# Patient Record
Sex: Female | Born: 1970 | Race: White | Hispanic: No | Marital: Single | Smoking: Never smoker
Health system: Southern US, Community
[De-identification: ages and names within clinical notes are randomized; demographics above are authoritative.]

## PROBLEM LIST (undated history)

## (undated) DIAGNOSIS — Z9289 Personal history of other medical treatment: Secondary | ICD-10-CM

## (undated) DIAGNOSIS — K589 Irritable bowel syndrome without diarrhea: Secondary | ICD-10-CM

## (undated) DIAGNOSIS — G43909 Migraine, unspecified, not intractable, without status migrainosus: Secondary | ICD-10-CM

## (undated) DIAGNOSIS — J302 Other seasonal allergic rhinitis: Secondary | ICD-10-CM

## (undated) DIAGNOSIS — M797 Fibromyalgia: Secondary | ICD-10-CM

## (undated) DIAGNOSIS — M1990 Unspecified osteoarthritis, unspecified site: Secondary | ICD-10-CM

## (undated) DIAGNOSIS — D509 Iron deficiency anemia, unspecified: Secondary | ICD-10-CM

## (undated) DIAGNOSIS — N920 Excessive and frequent menstruation with regular cycle: Secondary | ICD-10-CM

## (undated) DIAGNOSIS — Z9889 Other specified postprocedural states: Secondary | ICD-10-CM

## (undated) DIAGNOSIS — Z973 Presence of spectacles and contact lenses: Secondary | ICD-10-CM

## (undated) HISTORY — PX: UPPER GI ENDOSCOPY: SHX6162

---

## 1993-02-13 HISTORY — PX: TUBAL LIGATION: SHX77

## 2004-03-05 ENCOUNTER — Emergency Department: Payer: Self-pay | Admitting: Unknown Physician Specialty

## 2004-04-17 ENCOUNTER — Inpatient Hospital Stay: Payer: Self-pay | Admitting: Anesthesiology

## 2004-04-17 ENCOUNTER — Other Ambulatory Visit: Payer: Self-pay

## 2005-03-22 ENCOUNTER — Emergency Department: Payer: Self-pay | Admitting: Emergency Medicine

## 2006-02-13 HISTORY — PX: LAPAROSCOPIC CHOLECYSTECTOMY: SUR755

## 2010-02-26 ENCOUNTER — Emergency Department (HOSPITAL_COMMUNITY)
Admission: EM | Admit: 2010-02-26 | Discharge: 2010-02-26 | Payer: Self-pay | Source: Home / Self Care | Admitting: Emergency Medicine

## 2010-02-26 ENCOUNTER — Encounter: Payer: Self-pay | Admitting: Cardiology

## 2010-02-28 LAB — COMPREHENSIVE METABOLIC PANEL
ALT: 15 U/L (ref 0–35)
AST: 17 U/L (ref 0–37)
Albumin: 3.8 g/dL (ref 3.5–5.2)
Alkaline Phosphatase: 35 U/L — ABNORMAL LOW (ref 39–117)
BUN: 17 mg/dL (ref 6–23)
CO2: 24 mEq/L (ref 19–32)
Calcium: 8.9 mg/dL (ref 8.4–10.5)
Chloride: 107 mEq/L (ref 96–112)
Creatinine, Ser: 0.8 mg/dL (ref 0.4–1.2)
GFR calc Af Amer: >60 mL/min (ref >60)
GFR calc non Af Amer: >60 mL/min (ref >60)
Glucose, Bld: 69 mg/dL — ABNORMAL LOW (ref 70–99)
Potassium: 4 mEq/L (ref 3.5–5.1)
Sodium: 137 mEq/L (ref 135–145)
Total Bilirubin: 0.4 mg/dL (ref 0.3–1.2)
Total Protein: 6.6 g/dL (ref 6.0–8.3)

## 2010-02-28 LAB — CBC
HCT: 34.1 % — ABNORMAL LOW (ref 36.0–46.0)
Hemoglobin: 11.1 g/dL — ABNORMAL LOW (ref 12.0–15.0)
MCH: 29.4 pg (ref 26.0–34.0)
MCHC: 32.6 g/dL (ref 30.0–36.0)
MCV: 90.2 fL (ref 78.0–100.0)
Platelets: 220 10*3/uL (ref 150–400)
RBC: 3.78 MIL/uL — ABNORMAL LOW (ref 3.87–5.11)
RDW: 13.7 % (ref 11.5–15.5)
WBC: 4.4 10*3/uL (ref 4.0–10.5)

## 2010-02-28 LAB — POCT CARDIAC MARKERS
CKMB, poc: <1 ng/mL — ABNORMAL LOW (ref 1.0–8.0)
Myoglobin, poc: 30.3 ng/mL (ref 12–200)
Troponin i, poc: <0.05 ng/mL (ref 0.00–0.09)

## 2010-03-01 ENCOUNTER — Ambulatory Visit: Admission: RE | Admit: 2010-03-01 | Discharge: 2010-03-01 | Payer: Self-pay | Source: Home / Self Care

## 2010-03-01 ENCOUNTER — Ambulatory Visit
Admission: RE | Admit: 2010-03-01 | Discharge: 2010-03-01 | Payer: Self-pay | Source: Home / Self Care | Attending: Cardiology | Admitting: Cardiology

## 2010-03-01 DIAGNOSIS — R0789 Other chest pain: Secondary | ICD-10-CM | POA: Insufficient documentation

## 2010-03-03 ENCOUNTER — Telehealth (INDEPENDENT_AMBULATORY_CARE_PROVIDER_SITE_OTHER): Payer: Self-pay | Admitting: *Deleted

## 2010-03-04 ENCOUNTER — Ambulatory Visit (HOSPITAL_COMMUNITY)
Admission: RE | Admit: 2010-03-04 | Discharge: 2010-03-04 | Payer: Self-pay | Source: Home / Self Care | Attending: Physician Assistant | Admitting: Physician Assistant

## 2010-03-04 ENCOUNTER — Ambulatory Visit (HOSPITAL_COMMUNITY)
Admission: RE | Admit: 2010-03-04 | Discharge: 2010-03-04 | Payer: Self-pay | Source: Home / Self Care | Attending: Cardiology | Admitting: Cardiology

## 2010-03-04 ENCOUNTER — Ambulatory Visit: Admission: RE | Admit: 2010-03-04 | Discharge: 2010-03-04 | Payer: Self-pay | Source: Home / Self Care

## 2010-03-14 ENCOUNTER — Ambulatory Visit
Admission: RE | Admit: 2010-03-14 | Discharge: 2010-03-14 | Payer: Self-pay | Source: Home / Self Care | Attending: Cardiology | Admitting: Cardiology

## 2010-03-17 NOTE — Progress Notes (Signed)
Summary: stress echo appt  Phone Note Outgoing Call Call back at [facility] Phone [phone]   Call placed by: Stanton Kidney, EMT-P,  March 03, 2010 11:17 AM Action Taken: Phone Call Completed Summary of Call: Left message ref: stress echo appt/instructions. Stanton Kidney, EMT-P  March 03, 2010 11:17 AM

## 2010-03-23 NOTE — Assessment & Plan Note (Signed)
Summary: f/u echo/stress echo done 03-04-10/sl   Visit Type:  Follow-up Primary Provider:  None  CC:  CAD.  History of Present Illness: The patient presents for evaluation of chest pain. She had this about 2 weeks ago. It was somewhat atypical and she was seen in the emergency room. At that time she ruled out for myocardial infarction and was sent home. She came back to our clinic and had an exercise treadmill test with no ischemic changes but she did get chest pain. She was then referred for an exercise echocardiogram which demonstrated no evidence of ischemia or infarct. Since the 20th she has had no further chest discomfort, neck or arm discomfort. She has been doing her usual activities which includes dancing and she has had no symptoms related to this. She denies any palpitations, presyncope or syncope. She has had no weight gain or edema.  Current Medications (verified): 1)  Topamax 100 Mg Tabs (Topiramate) .... 3 By Mouth Daily 2)  Doxepin Hcl 10 Mg Caps (Doxepin Hcl) .... 2 By Mouth Daily 3)  Vicodin Es 7.5-750 Mg Tabs (Hydrocodone-Acetaminophen) .... As Needed 4)  Promethazine Hcl 25 Mg Tabs (Promethazine Hcl) .... As Needed 5)  Alprazolam 0.5 Mg Tabs (Alprazolam) .... 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   Vital Signs:  Patient profile:   40 year old female Height:      64 inches Weight:      132 pounds BMI:     22.74 Pulse rate:   86 / minute Resp:     16 per minute BP sitting:   111 / 66  (right arm)  Vitals Entered By: Marrion Coy, CNA (March 14, 2010 12:12 PM)  Physical Exam  General:  Well developed, well  nourished, in no acute distress. Head:  normocephalic and atraumatic Eyes:  PERRLA/EOM intact; conjunctiva and lids normal. Mouth:  Teeth, gums and palate normal. Oral mucosa normal. Neck:  Neck supple, no JVD. No masses, thyromegaly or abnormal cervical nodes. Chest Wall:  no deformities or breast masses noted Lungs:  Clear bilaterally to auscultation and percussion. Abdomen:  Bowel sounds positive; abdomen soft and non-tender without masses, organomegaly, or hernias noted. No hepatosplenomegaly. Msk:  Back normal, normal gait. Muscle strength and tone normal. Extremities:  No clubbing or cyanosis. Neurologic:  Alert and oriented x 3. Skin:  Intact without lesions or rashes. Cervical Nodes:  no significant adenopathy Inguinal Nodes:  no significant adenopathy Psych:  Normal affect.   Detailed Cardiovascular Exam  Neck    Carotids: Carotids full and equal bilaterally without bruits.      Neck Veins: Normal, no JVD.    Heart    Inspection: no deformities or lifts noted.      Palpation: normal PMI with no thrills palpable.      Auscultation: regular rate and rhythm, S1, S2 without murmurs, rubs, gallops, or clicks.    Vascular    Abdominal Aorta: no palpable masses, pulsations, or audible bruits.      Femoral Pulses: normal femoral pulses bilaterally.      Pedal Pulses: normal pedal pulses bilaterally.  Radial Pulses: normal radial pulses bilaterally.      Peripheral Circulation: no clubbing, cyanosis, or edema noted with normal capillary refill.     EKG  Procedure date:  02/26/2010  Findings:      Sinus rhythm with sinus arrhythmia, axis within normal limits, intervals within normal limits, no acute ST-T wave changes, RSR prime V1  Impression & Recommendations:  Problem # 1:  CHEST PAIN, ATYPICAL (ICD-786.59) Her chest pain is atypical. She had a negative stress test. She is having no further symptoms. No further cardiovascular testing is suggested. If this recurs I  would suggest a possible GI etiology.  Patient Instructions: 1)  Your physician recommends that you schedule a follow-up appointment as needed 2)  Your physician recommends that you continue on your current medications as directed. Please refer to the Current Medication list given to you today.

## 2010-11-15 ENCOUNTER — Inpatient Hospital Stay (INDEPENDENT_AMBULATORY_CARE_PROVIDER_SITE_OTHER)
Admission: RE | Admit: 2010-11-15 | Discharge: 2010-11-15 | Disposition: A | Discharge status: Home / Self Care | Payer: Self-pay | Source: Ambulatory Visit | Attending: Family Medicine | Admitting: Family Medicine

## 2010-11-15 DIAGNOSIS — B86 Scabies: Secondary | ICD-10-CM

## 2011-07-15 ENCOUNTER — Emergency Department (INDEPENDENT_AMBULATORY_CARE_PROVIDER_SITE_OTHER)
Admission: EM | Admit: 2011-07-15 | Discharge: 2011-07-15 | Disposition: A | Discharge status: Home / Self Care | Payer: Self-pay | Source: Home / Self Care | Attending: Emergency Medicine | Admitting: Emergency Medicine

## 2011-07-15 ENCOUNTER — Encounter (HOSPITAL_COMMUNITY): Payer: Self-pay | Admitting: *Deleted

## 2011-07-15 ENCOUNTER — Emergency Department (INDEPENDENT_AMBULATORY_CARE_PROVIDER_SITE_OTHER): Payer: Self-pay

## 2011-07-15 DIAGNOSIS — K2970 Gastritis, unspecified, without bleeding: Secondary | ICD-10-CM

## 2011-07-15 HISTORY — DX: Fibromyalgia: M79.7

## 2011-07-15 HISTORY — DX: Migraine, unspecified, not intractable, without status migrainosus: G43.909

## 2011-07-15 LAB — DIFFERENTIAL
Basophils Absolute: 0 10*3/uL (ref 0.0–0.1)
Basophils Relative: 0 % (ref 0–1)
Eosinophils Absolute: 0.1 10*3/uL (ref 0.0–0.7)
Eosinophils Relative: 1 % (ref 0–5)
Lymphocytes Relative: 25 % (ref 12–46)
Lymphs Abs: 1.6 10*3/uL (ref 0.7–4.0)
Monocytes Absolute: 0.5 10*3/uL (ref 0.1–1.0)
Monocytes Relative: 7 % (ref 3–12)
Neutro Abs: 4.4 10*3/uL (ref 1.7–7.7)
Neutrophils Relative %: 66 % (ref 43–77)

## 2011-07-15 LAB — POCT URINALYSIS DIP (DEVICE)
Bilirubin Urine: NEGATIVE
Glucose, UA: NEGATIVE mg/dL
Ketones, ur: NEGATIVE mg/dL
Leukocytes, UA: NEGATIVE
Nitrite: NEGATIVE
Protein, ur: NEGATIVE mg/dL
Specific Gravity, Urine: <=1.005 (ref 1.005–1.030)
Urobilinogen, UA: 0.2 mg/dL (ref 0.0–1.0)
pH: 7 (ref 5.0–8.0)

## 2011-07-15 LAB — WET PREP, GENITAL
Trich, Wet Prep: NONE SEEN
WBC, Wet Prep HPF POC: NONE SEEN
Yeast Wet Prep HPF POC: NONE SEEN

## 2011-07-15 LAB — CBC
HCT: 35.8 % — ABNORMAL LOW (ref 36.0–46.0)
Hemoglobin: 11.8 g/dL — ABNORMAL LOW (ref 12.0–15.0)
MCH: 30.1 pg (ref 26.0–34.0)
MCHC: 33 g/dL (ref 30.0–36.0)
MCV: 91.3 fL (ref 78.0–100.0)
Platelets: 266 10*3/uL (ref 150–400)
RBC: 3.92 MIL/uL (ref 3.87–5.11)
RDW: 14.7 % (ref 11.5–15.5)
WBC: 6.6 10*3/uL (ref 4.0–10.5)

## 2011-07-15 LAB — POCT PREGNANCY, URINE: Preg Test, Ur: NEGATIVE

## 2011-07-15 LAB — COMPREHENSIVE METABOLIC PANEL
ALT: 87 U/L — ABNORMAL HIGH (ref 0–35)
AST: 45 U/L — ABNORMAL HIGH (ref 0–37)
Albumin: 3.9 g/dL (ref 3.5–5.2)
Alkaline Phosphatase: 66 U/L (ref 39–117)
BUN: 11 mg/dL (ref 6–23)
CO2: 29 mEq/L (ref 19–32)
Calcium: 9.4 mg/dL (ref 8.4–10.5)
Chloride: 101 mEq/L (ref 96–112)
Creatinine, Ser: 0.59 mg/dL (ref 0.50–1.10)
GFR calc Af Amer: >90 mL/min (ref >90)
GFR calc non Af Amer: >90 mL/min (ref >90)
Glucose, Bld: 80 mg/dL (ref 70–99)
Potassium: 3.5 mEq/L (ref 3.5–5.1)
Sodium: 139 mEq/L (ref 135–145)
Total Bilirubin: 0.2 mg/dL — ABNORMAL LOW (ref 0.3–1.2)
Total Protein: 7.5 g/dL (ref 6.0–8.3)

## 2011-07-15 LAB — LIPASE, BLOOD: Lipase: 27 U/L (ref 11–59)

## 2011-07-15 LAB — POCT H PYLORI SCREEN: H. PYLORI SCREEN, POC: NEGATIVE

## 2011-07-15 MED ORDER — GI COCKTAIL ~~LOC~~
ORAL | Status: AC
  Filled 2011-07-15: qty 30

## 2011-07-15 MED ORDER — OMEPRAZOLE 20 MG PO CPDR: 20.0000 mg | DELAYED_RELEASE_CAPSULE | Freq: Every day | ORAL | Status: DC

## 2011-07-15 MED ORDER — SUCRALFATE 1 GM/10ML PO SUSP: 1.0000 g | Freq: Four times a day (QID) | ORAL | Status: DC

## 2011-07-15 MED ORDER — GI COCKTAIL ~~LOC~~
30.0000 mL | Freq: Once | ORAL | Status: AC
  Administered 2011-07-15: 30 mL via ORAL

## 2011-07-15 MED ORDER — ONDANSETRON 8 MG PO TBDP: 8.0000 mg | ORAL_TABLET | Freq: Three times a day (TID) | ORAL | Status: AC | PRN

## 2011-07-15 NOTE — ED Notes (Addendum)
Pt with onset of n/v and abdominal pain x 6 days - denies diarrhea or constipation - c/o feeling that hands/feet and abd swollen and intermittent dizziness - intermittent vomiting - constant nausea - ate lunch today one pm - vomited post lunch - keeping liquids down - denies vaginal discharge or irritation

## 2011-07-15 NOTE — ED Provider Notes (Signed)
Chief Complaint  Patient presents with  . Abdominal Pain  . Nausea  . Emesis    History of Present Illness:   Mrs. Elizabeth Kennedy is a 41 year old female with a one-week history of intermittent pain in the epigastrium and radiates to both lower quadrants. It feels like a cramping or squeezing pain and is rated 6-7/10 in intensity. The pain comes and goes, but can last for 3-4 hours at a time. It's worse with movement and better if she lies still. The pain is not any worse if she is food, but she does get nauseated if she eats. She has had some swelling of the abdomen and also noted generalized swelling of her arms and legs that occurred several days before the pain came on but now is gone. She denies any fever, chills, anorexia, or weight loss. She has felt constant nausea along with the pain and vomited once. She denies any blood in the vomitus or bilious emesis. Her last menstrual period was about 2 weeks ago. It lasted longer than usual and she had some spotting afterwards. She has had a bilateral tubal ligation. She has a history of GI issues in the past, she's not sure what the diagnosis was. She took medication for a few years but now is not taking anything. She has had a cholecystectomy. She denies any history of ulcers or gastritis.  Review of Systems:  Other than noted above, the patient denies any of the following symptoms: Constitutional:  No fever, chills, fatigue, weight loss or anorexia. Lungs:  No cough or shortness of breath. Heart:  No chest pain, palpitations, syncope or edema.  No cardiac history. Abdomen:  No nausea, vomiting, hematememesis, melena, diarrhea, or hematochezia. GU:  No dysuria, frequency, urgency, or hematuria. Gyn:  No vaginal discharge, itching, abnormal bleeding, dyspareunia, or pelvic pain. Skin:  No rash or itching.  PMFSH:  Past medical history, family history, social history, meds, and allergies were reviewed.  No prior abdominal surgeries, past history of GI  problems, STDs or GYN problems.  No history of aspirin or NSAID use.  No excessive alcohol intake.  Physical Exam:   Vital signs:  BP 114/64  Pulse 78  Temp(Src) 98.5 F (36.9 C) (Oral)  Resp 17  SpO2 98%  LMP 07/02/2011 Gen:  Alert, oriented, in no distress. Lungs:  Breath sounds clear and equal bilaterally.  No wheezes, rales or rhonchi. Heart:  Regular rhythm.  No gallops or murmers.   Abdomen:  Abdomen was soft, flat, nondistended. She has pain to palpation in the epigastrium and both lower cards but no guarding or rebound. No organomegaly or mass. Bowel sounds are normally active. Pelvic:  Normal external genitalia, vaginal and cervical mucosa were normal. She has mild cervical motion pain. Uterus is midposition normal in size and shape and is mildly tender. She has mild bilateral adnexal tenderness to palpation but no masses. Skin:  Clear, warm and dry.  No rash.  Labs:   Results for orders placed during the hospital encounter of 07/15/11  POCT URINALYSIS DIP (DEVICE)      Component Value Range   Glucose, UA NEGATIVE  NEGATIVE (mg/dL)   Bilirubin Urine NEGATIVE  NEGATIVE    Ketones, ur NEGATIVE  NEGATIVE (mg/dL)   Specific Gravity, Urine <=1.005  1.005 - 1.030    Hgb urine dipstick TRACE (*) NEGATIVE    pH 7.0  5.0 - 8.0    Protein, ur NEGATIVE  NEGATIVE (mg/dL)   Urobilinogen, UA 0.2  0.0 -  1.0 (mg/dL)   Nitrite NEGATIVE  NEGATIVE    Leukocytes, UA NEGATIVE  NEGATIVE   POCT PREGNANCY, URINE      Component Value Range   Preg Test, Ur NEGATIVE  NEGATIVE   CBC      Component Value Range   WBC 6.6  4.0 - 10.5 (K/uL)   RBC 3.92  3.87 - 5.11 (MIL/uL)   Hemoglobin 11.8 (*) 12.0 - 15.0 (g/dL)   HCT 16.1 (*) 09.6 - 46.0 (%)   MCV 91.3  78.0 - 100.0 (fL)   MCH 30.1  26.0 - 34.0 (pg)   MCHC 33.0  30.0 - 36.0 (g/dL)   RDW 04.5  40.9 - 81.1 (%)   Platelets 266  150 - 400 (K/uL)  DIFFERENTIAL      Component Value Range   Neutrophils Relative 66  43 - 77 (%)   Neutro Abs 4.4   1.7 - 7.7 (K/uL)   Lymphocytes Relative 25  12 - 46 (%)   Lymphs Abs 1.6  0.7 - 4.0 (K/uL)   Monocytes Relative 7  3 - 12 (%)   Monocytes Absolute 0.5  0.1 - 1.0 (K/uL)   Eosinophils Relative 1  0 - 5 (%)   Eosinophils Absolute 0.1  0.0 - 0.7 (K/uL)   Basophils Relative 0  0 - 1 (%)   Basophils Absolute 0.0  0.0 - 0.1 (K/uL)  COMPREHENSIVE METABOLIC PANEL      Component Value Range   Sodium 139  135 - 145 (mEq/L)   Potassium 3.5  3.5 - 5.1 (mEq/L)   Chloride 101  96 - 112 (mEq/L)   CO2 29  19 - 32 (mEq/L)   Glucose, Bld 80  70 - 99 (mg/dL)   BUN 11  6 - 23 (mg/dL)   Creatinine, Ser 9.14  0.50 - 1.10 (mg/dL)   Calcium 9.4  8.4 - 78.2 (mg/dL)   Total Protein 7.5  6.0 - 8.3 (g/dL)   Albumin 3.9  3.5 - 5.2 (g/dL)   AST 45 (*) 0 - 37 (U/L)   ALT 87 (*) 0 - 35 (U/L)   Alkaline Phosphatase 66  39 - 117 (U/L)   Total Bilirubin 0.2 (*) 0.3 - 1.2 (mg/dL)   GFR calc non Af Amer >90  >90 (mL/min)   GFR calc Af Amer >90  >90 (mL/min)  LIPASE, BLOOD      Component Value Range   Lipase 27  11 - 59 (U/L)  WET PREP, GENITAL      Component Value Range   Yeast Wet Prep HPF POC NONE SEEN  NONE SEEN    Trich, Wet Prep NONE SEEN  NONE SEEN    Clue Cells Wet Prep HPF POC FEW (*) NONE SEEN    WBC, Wet Prep HPF POC NONE SEEN  NONE SEEN   POCT H PYLORI SCREEN      Component Value Range   H. PYLORI SCREEN, POC NEGATIVE  NEGATIVE      Radiology:  Dg Abd Acute W/chest  07/15/2011  *RADIOLOGY REPORT*  Clinical Data: Abdominal pain for 1 week.  ACUTE ABDOMEN SERIES (ABDOMEN 2 VIEW & CHEST 1 VIEW)  Comparison: Chest x-ray 02/26/2010  Findings: Prior cholecystectomy.  Moderate stool burden throughout the colon. The bowel gas pattern is normal.  There is no evidence of free intraperitoneal air.  No suspicious radio-opaque calculi or other significant radiographic abnormality is seen. Heart size and mediastinal contours are within normal limits.  Both lungs are clear.  IMPRESSION: Moderate stool burden.  No  acute findings.  Original Report Authenticated By: Cyndie Chime, M.D.    Assessment:  The encounter diagnosis was Gastritis.  Plan:   1.  The following meds were prescribed:   New Prescriptions   OMEPRAZOLE (PRILOSEC) 20 MG CAPSULE    Take 1 capsule (20 mg total) by mouth daily.   ONDANSETRON (ZOFRAN ODT) 8 MG DISINTEGRATING TABLET    Take 1 tablet (8 mg total) by mouth every 8 (eight) hours as needed for nausea.   SUCRALFATE (CARAFATE) 1 GM/10ML SUSPENSION    Take 10 mLs (1 g total) by mouth 4 (four) times daily.   2.  The patient was instructed in symptomatic care and handouts were given. 3.  The patient was told to return if becoming worse in any way, if no better in 3 or 4 days, and given some red flag symptoms that would indicate earlier return.  Follow up:  The patient was told to follow up with Dr. Bosie Clos next week.     Reuben Likes, MD 07/15/11 [phone]

## 2011-07-15 NOTE — Discharge Instructions (Signed)
Gastritis Gastritis is an inflammation (the body's way of reacting to injury and/or infection) of the stomach. It is often caused by viral or bacterial (germ) infections. It can also be caused by chemicals (including alcohol) and medications. This illness may be associated with generalized malaise (feeling tired, not well), cramps, and fever. The illness may last 2 to 7 days. If symptoms of gastritis continue, gastroscopy (looking into the stomach with a telescope-like instrument), biopsy (taking tissue samples), and/or blood tests may be necessary to determine the cause. Antibiotics will not affect the illness unless there is a bacterial infection present. One common bacterial cause of gastritis is an organism known as H. Pylori. This can be treated with antibiotics. Other forms of gastritis are caused by too much acid in the stomach. They can be treated with medications such as H2 blockers and antacids. Home treatment is usually all that is needed. Young children will quickly become dehydrated (loss of body fluids) if vomiting and diarrhea are both present. Medications may be given to control nausea. Medications are usually not given for diarrhea unless especially bothersome. Some medications slow the removal of the virus from the gastrointestinal tract. This slows down the healing process. HOME CARE INSTRUCTIONS Home care instructions for nausea and vomiting:  For adults: drink small amounts of fluids often. Drink at least 2 quarts a day. Take sips frequently. Do not drink large amounts of fluid at one time. This may worsen the nausea.   Only take over-the-counter or prescription medicines for pain, discomfort, or fever as directed by your caregiver.   Drink clear liquids only. Those are anything you can see through such as water, broth, or soft drinks.   Once you are keeping clear liquids down, you may start full liquids, soups, juices, and ice cream or sherbet. Slowly add bland (plain, not spicy)  foods to your diet.  Home care instructions for diarrhea:  Diarrhea can be caused by bacterial infections or a virus. Your condition should improve with time, rest, fluids, and/or anti-diarrheal medication.   Until your diarrhea is under control, you should drink clear liquids often in small amounts. Clear liquids include: water, broth, jell-o water and weak tea.  Avoid:  Milk.   Fruits.   Tobacco.   Alcohol.   Extremely hot or cold fluids.   Too much intake of anything at one time.  When your diarrhea stops you may add the following foods, which help the stool to become more formed:  Rice.   Bananas.   Apples without skin.   Dry toast.  Once these foods are tolerated you may add low-fat yogurt and low-fat cottage cheese. They will help to restore the normal bacterial balance in your bowel. Wash your hands well to avoid spreading bacteria (germ) or virus. SEEK IMMEDIATE MEDICAL CARE IF:   You are unable to keep fluids down.   Vomiting or diarrhea become persistent (constant).   Abdominal pain develops, increases, or localizes. (Right sided pain can be appendicitis. Left sided pain in adults can be diverticulitis.)   You develop a fever (an oral temperature above 102 F (38.9 C)).   Diarrhea becomes excessive or contains blood or mucus.   You have excessive weakness, dizziness, fainting or extreme thirst.   You are not improving or you are getting worse.   You have any other questions or concerns.  Document Released: 01/24/2001 Document Revised: 01/19/2011 Document Reviewed: 01/30/2005 [employer] Patient Information 2012 [employer]. 

## 2011-07-17 LAB — GC/CHLAMYDIA PROBE AMP, GENITAL
Chlamydia, DNA Probe: NEGATIVE
GC Probe Amp, Genital: NEGATIVE

## 2012-01-23 ENCOUNTER — Emergency Department (HOSPITAL_COMMUNITY)
Admission: EM | Admit: 2012-01-23 | Discharge: 2012-01-24 | Disposition: A | Discharge status: Home / Self Care | Payer: Self-pay | Attending: Emergency Medicine | Admitting: Emergency Medicine

## 2012-01-23 ENCOUNTER — Emergency Department (HOSPITAL_COMMUNITY): Payer: Self-pay

## 2012-01-23 ENCOUNTER — Encounter (HOSPITAL_COMMUNITY): Payer: Self-pay | Admitting: *Deleted

## 2012-01-23 ENCOUNTER — Encounter (HOSPITAL_COMMUNITY): Payer: Self-pay | Admitting: Emergency Medicine

## 2012-01-23 ENCOUNTER — Emergency Department (INDEPENDENT_AMBULATORY_CARE_PROVIDER_SITE_OTHER)
Admission: EM | Admit: 2012-01-23 | Discharge: 2012-01-23 | Disposition: A | Discharge status: Nursing Home (Includes ICF) | Payer: Self-pay | Source: Home / Self Care | Attending: Emergency Medicine | Admitting: Emergency Medicine

## 2012-01-23 DIAGNOSIS — Z8739 Personal history of other diseases of the musculoskeletal system and connective tissue: Secondary | ICD-10-CM | POA: Insufficient documentation

## 2012-01-23 DIAGNOSIS — Z79899 Other long term (current) drug therapy: Secondary | ICD-10-CM | POA: Insufficient documentation

## 2012-01-23 DIAGNOSIS — R0789 Other chest pain: Secondary | ICD-10-CM | POA: Insufficient documentation

## 2012-01-23 DIAGNOSIS — R079 Chest pain, unspecified: Secondary | ICD-10-CM

## 2012-01-23 DIAGNOSIS — Z8709 Personal history of other diseases of the respiratory system: Secondary | ICD-10-CM | POA: Insufficient documentation

## 2012-01-23 DIAGNOSIS — Z8669 Personal history of other diseases of the nervous system and sense organs: Secondary | ICD-10-CM | POA: Insufficient documentation

## 2012-01-23 DIAGNOSIS — R071 Chest pain on breathing: Secondary | ICD-10-CM

## 2012-01-23 DIAGNOSIS — R0781 Pleurodynia: Secondary | ICD-10-CM

## 2012-01-23 DIAGNOSIS — Z8679 Personal history of other diseases of the circulatory system: Secondary | ICD-10-CM | POA: Insufficient documentation

## 2012-01-23 LAB — POCT I-STAT TROPONIN I: Troponin i, poc: 0 ng/mL (ref 0.00–0.08)

## 2012-01-23 LAB — CBC
HCT: 33.1 % — ABNORMAL LOW (ref 36.0–46.0)
MCHC: 32.6 g/dL (ref 30.0–36.0)
Platelets: 246 10*3/uL (ref 150–400)
RDW: 14.7 % (ref 11.5–15.5)
WBC: 7.1 10*3/uL (ref 4.0–10.5)

## 2012-01-23 LAB — BASIC METABOLIC PANEL
Chloride: 101 mEq/L (ref 96–112)
GFR calc Af Amer: >90 mL/min (ref >90)
GFR calc non Af Amer: >90 mL/min (ref >90)
Potassium: 3.8 mEq/L (ref 3.5–5.1)
Sodium: 138 mEq/L (ref 135–145)

## 2012-01-23 MED ORDER — IBUPROFEN 400 MG PO TABS
600.0000 mg | ORAL_TABLET | Freq: Once | ORAL | Status: AC
  Administered 2012-01-23: 600 mg via ORAL
  Filled 2012-01-23: qty 2

## 2012-01-23 MED ORDER — OXYCODONE-ACETAMINOPHEN 5-325 MG PO TABS
1.0000 | ORAL_TABLET | Freq: Once | ORAL | Status: AC
  Administered 2012-01-23: 1 via ORAL
  Filled 2012-01-23: qty 1

## 2012-01-23 NOTE — ED Notes (Signed)
Pt reports zero pain when waking up in the mornings but increased pain on right chest throughout the day. Pt states pain increases with exhalation.

## 2012-01-23 NOTE — ED Notes (Signed)
Pt reports a long car trip--14 hours-- a week ago.  Before she got back home she started having anterior lower right chest pai, -which is sharp with a deep breath.  The pain has gotten worse each day.    She denies recent URI or fever.

## 2012-01-23 NOTE — ED Notes (Signed)
Pt reports last Monday she started to have R sided CP under R breast described as sharp and has been increasing everyday, reports worse pain with deep breath (esp on exhale); pt reports started after driving from [facility] to [place]; pt reports some nausea on Friday; intermittent SOB

## 2012-01-23 NOTE — ED Provider Notes (Signed)
History     CSN: [id number]  Arrival date & time 01/23/12  1728   First MD Initiated Contact with Patient 01/23/12 1859      Chief Complaint  Patient presents with  . Chest Pain    (Consider location/radiation/quality/duration/timing/severity/associated sxs/prior treatment) HPI Comments: She presents urgent care tonight complaining of ongoing and worsening right chest pain (patient points to lower and lateral aspect of her right rib cage), she describes been having the pain for a least 9 days after she returned from a driving trip to [place]. She does have some sporadic sensation of shortness of breath. Denies any cough congestion or fevers and no urinary symptoms. Patient is not a smoker but not taking any hormonal treatments no recent surgeries, has never been diagnosed with a DVT or PE before. Describes that the pain is somewhat constant now but it does worsen when she takes a deep breath, and even more when she exhales.  The history is provided by the patient.    Past Medical History  Diagnosis Date  . Migraines   . Fibromyalgia   . Neurological disorder     Past Surgical History  Procedure Date  . Cholecystectomy   . Tubal ligation     No family history on file.  History  Substance Use Topics  . Smoking status: Never Smoker   . Smokeless tobacco: Not on file  . Alcohol Use: No    OB History    Grav Para Term Preterm Abortions TAB SAB Ect Mult Living                  Review of Systems  Constitutional: Negative for fever, diaphoresis, activity change and appetite change.  HENT: Negative for ear pain, mouth sores, trouble swallowing, neck pain, neck stiffness, voice change and sinus pressure.   Eyes: Negative for discharge.  Respiratory: Negative for apnea and chest tightness.   Cardiovascular: Positive for chest pain. Negative for palpitations and leg swelling.  Gastrointestinal: Negative for abdominal pain.  Musculoskeletal: Negative for back pain.    Neurological: Negative for dizziness.    Allergies  Review of patient's allergies indicates no known allergies.  Home Medications   Current Outpatient Rx  Name  Route  Sig  Dispense  Refill  . HYDROCODONE-ACETAMINOPHEN 7.5-325 MG PO TABS   Oral   Take 1 tablet by mouth every 6 (six) hours as needed. 1/2 tablet         . NORTRIPTYLINE HCL PO   Oral   Take 20 mg by mouth 1 day or 1 dose.         Marland Kitchen OMEPRAZOLE 20 MG PO CPDR   Oral   Take 1 capsule (20 mg total) by mouth daily.   15 capsule   0   . PROMETHAZINE HCL 25 MG PO TABS   Oral   Take 25 mg by mouth every 6 (six) hours as needed.         . SUCRALFATE 1 GM/10ML PO SUSP   Oral   Take 10 mLs (1 g total) by mouth 4 (four) times daily.   420 mL   0   . TOPAMAX PO   Oral   Take by mouth 2 (two) times daily.           BP 103/67  Pulse 89  Temp 98.2 F (36.8 C) (Oral)  Resp 16  SpO2 100%  Physical Exam  Nursing note and vitals reviewed. Constitutional: Vital signs are normal. She appears well-developed  and well-nourished.  Non-toxic appearance. She does not have a sickly appearance. She does not appear ill. No distress.  HENT:  Head: Normocephalic.  Eyes: Conjunctivae normal are normal.  Cardiovascular: Normal rate and regular rhythm.  Exam reveals no gallop and no friction rub.   No murmur heard. Pulmonary/Chest: Effort normal and breath sounds normal. No respiratory distress. She has no wheezes. She has no rales. She exhibits no tenderness.    Skin: No rash noted. No erythema.    ED Course  Procedures (including critical care time)  Labs Reviewed - No data to display No results found.   1. Pleuritic chest pain       MDM  Right-sided pleuritic chest pain. Patient is describing pain for more than a week status post a long driving trip is oxygenating well in room air afebrile non-tachycardic and looks comfortable. Patient had been transfer to the emergency department to be consider for low  probability pulmonary thromboembolic event. Patient is agreeable to transfer and will pursue further evaluation in the emergency department.        Jimmie Molly, MD 01/23/12 Ernestina [place]

## 2012-01-24 LAB — D-DIMER, QUANTITATIVE: D-Dimer, Quant: 0.35 ug/mL-FEU (ref 0.00–0.48)

## 2012-01-24 MED ORDER — IBUPROFEN 600 MG PO TABS: 600.0000 mg | ORAL_TABLET | Freq: Three times a day (TID) | ORAL | Status: DC | PRN

## 2012-01-24 MED ORDER — OXYCODONE-ACETAMINOPHEN 5-325 MG PO TABS: 1.0000 | ORAL_TABLET | ORAL | Status: DC | PRN

## 2012-01-24 NOTE — ED Provider Notes (Signed)
History     CSN: [id number]  Arrival date & time 01/23/12  2440   First MD Initiated Contact with Patient 01/23/12 2305      Chief Complaint  Patient presents with  . Chest Pain     The history is provided by the patient.   patient reports developing right lateral chest pain 2 days ago that seems to be worse when she takes a deep breath.  No history of DVT or pulmonary embolism.  She does have a history of pleurisy.  Her father his had some arterial lower extremity issues but no venous issues.  The patient did recently drive from [place] to [place].  She's had some intermittent shortness of breath.  Her chest pain is intermittent.  She has no chest pain at night.  She usually awakens and does not have pain but reports that the right-sided chest pain gets worse as the day goes on.  Her pain is worse with movement and palpation and deep breathing.  Nothing improves her pain.  She tried over-the-counter pain medications without symptom relief.  Past Medical History  Diagnosis Date  . Migraines   . Fibromyalgia   . 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 Cholecystectomy   . Tubal ligation     No family history on file.  History  Substance Use Topics  . Smoking status: Never Smoker   . Smokeless tobacco: Not on file  . Alcohol Use: No    OB History    Grav Para Term Preterm Abortions TAB SAB Ect Mult Living                  Review of Systems  Cardiovascular: Positive for chest pain.  All other systems reviewed and are negative.    Allergies  Review of patient's allergies indicates no known allergies.  Home Medications   Current Outpatient Rx  Name  Route  Sig  Dispense  Refill  . CYCLOBENZAPRINE HCL 10 MG PO TABS   Oral   Take 10 mg by mouth 3 (three) times daily as needed. For pain         . HYDROCODONE-ACETAMINOPHEN 7.5-325 MG PO TABS   Oral   Take 0.5 tablets by mouth every 6 (six) hours as  needed. For pain         . LAMOTRIGINE 150 MG PO TABS   Oral   Take 150 mg by mouth daily.         Marland Kitchen PROMETHAZINE HCL 25 MG PO TABS   Oral   Take 25 mg by mouth every 6 (six) hours as needed. For nausea         . IBUPROFEN 600 MG PO TABS   Oral   Take 1 tablet (600 mg total) by mouth every 8 (eight) hours as needed for pain.   15 tablet   0   . OXYCODONE-ACETAMINOPHEN 5-325 MG PO TABS   Oral   Take 1 tablet by mouth every 4 (four) hours as needed for pain.   20 tablet   0   . SUCRALFATE 1 GM/10ML PO SUSP   Oral   Take 10 mLs (1 g total) by mouth 4 (four) times daily.   420 mL   0     BP 122/65  Pulse 90  Temp 98.3 F (36.8 C) (Oral)  Resp 18  SpO2 100%  LMP 01/19/2012  Physical Exam  Nursing note and vitals reviewed. Constitutional: She is oriented to person, place, and time. She appears well-developed and well-nourished. No distress.  HENT:  Head: Normocephalic and atraumatic.  Eyes: EOM are normal.  Neck: Normal range of motion.  Cardiovascular: Normal rate, regular rhythm and normal heart sounds.   Pulmonary/Chest: Effort normal and breath sounds normal.       Mild right-sided chest wall tenderness without rash.  No crepitus.  Abdominal: Soft. She exhibits no distension. There is no tenderness.  Musculoskeletal: Normal range of motion.  Neurological: She is alert and oriented to person, place, and time.  Skin: Skin is warm and dry.  Psychiatric: She has a normal mood and affect. Judgment normal.    ED Course  Procedures (including critical care time)  Labs Reviewed  CBC - Abnormal; Notable for the following:    RBC 3.73 (*)     Hemoglobin 10.8 (*)     HCT 33.1 (*)     All other components within normal limits  BASIC METABOLIC PANEL  POCT I-STAT TROPONIN I  D-DIMER, QUANTITATIVE   Dg Chest 2 View  01/23/2012  *RADIOLOGY REPORT*  Clinical Data: Right-sided chest pain.  History of benign pulmonary nodules.  CHEST - 2 VIEW  Comparison:  02/26/2010.  Findings:  Cardiopericardial silhouette within normal limits. Mediastinal contours normal. Trachea midline.  No airspace disease or effusion. Cholecystectomy clips are present in the right upper quadrant.  IMPRESSION: No active cardiopulmonary disease.   Original Report Authenticated By: Andreas Newport, M.D.     Date: 01/24/2012  Rate: 85  Rhythm: normal sinus rhythm  QRS Axis: normal  Intervals: normal  ST/T Wave abnormalities: normal  Conduction Disutrbances: none  Narrative Interpretation:   Old EKG Reviewed: No significant changes noted     1. Chest pain       MDM  Chest x-ray EKG and d-dimer all normal.  I suspect this is pleurisy.  Her EKG is normal sinus rhythm.  Discharge home with anti-inflammatories and pain medicine.  Close PCP followup.  The patient understands return to the ER for new or worsening symptoms        Lyanne Co, MD 01/24/12 [phone]

## 2012-11-08 ENCOUNTER — Ambulatory Visit (HOSPITAL_COMMUNITY)
Admission: RE | Admit: 2012-11-08 | Discharge: 2012-11-08 | Disposition: A | Discharge status: Home / Self Care | Payer: Self-pay | Source: Ambulatory Visit | Attending: Family Medicine | Admitting: Family Medicine

## 2012-11-08 ENCOUNTER — Other Ambulatory Visit (HOSPITAL_COMMUNITY): Payer: Self-pay | Admitting: Family Medicine

## 2012-11-08 DIAGNOSIS — K838 Other specified diseases of biliary tract: Secondary | ICD-10-CM | POA: Insufficient documentation

## 2012-11-08 DIAGNOSIS — N83209 Unspecified ovarian cyst, unspecified side: Secondary | ICD-10-CM | POA: Insufficient documentation

## 2012-11-08 DIAGNOSIS — K37 Unspecified appendicitis: Secondary | ICD-10-CM

## 2012-11-08 DIAGNOSIS — N949 Unspecified condition associated with female genital organs and menstrual cycle: Secondary | ICD-10-CM | POA: Insufficient documentation

## 2012-11-08 DIAGNOSIS — N2889 Other specified disorders of kidney and ureter: Secondary | ICD-10-CM | POA: Insufficient documentation

## 2012-11-08 DIAGNOSIS — K7689 Other specified diseases of liver: Secondary | ICD-10-CM | POA: Insufficient documentation

## 2012-11-08 DIAGNOSIS — R1031 Right lower quadrant pain: Secondary | ICD-10-CM | POA: Insufficient documentation

## 2012-11-08 MED ORDER — IOHEXOL 300 MG/ML  SOLN
50.0000 mL | Freq: Once | INTRAMUSCULAR | Status: AC | PRN
  Administered 2012-11-08: 50 mL via ORAL

## 2012-11-08 MED ORDER — IOHEXOL 300 MG/ML  SOLN
100.0000 mL | Freq: Once | INTRAMUSCULAR | Status: AC | PRN
  Administered 2012-11-08: 100 mL via INTRAVENOUS

## 2012-11-09 ENCOUNTER — Encounter (HOSPITAL_COMMUNITY): Payer: Self-pay

## 2012-11-09 ENCOUNTER — Emergency Department (HOSPITAL_COMMUNITY)
Admission: EM | Admit: 2012-11-09 | Discharge: 2012-11-09 | Disposition: A | Discharge status: Home / Self Care | Payer: Self-pay | Attending: Emergency Medicine | Admitting: Emergency Medicine

## 2012-11-09 ENCOUNTER — Emergency Department (HOSPITAL_COMMUNITY): Payer: Self-pay

## 2012-11-09 DIAGNOSIS — Z3202 Encounter for pregnancy test, result negative: Secondary | ICD-10-CM | POA: Insufficient documentation

## 2012-11-09 DIAGNOSIS — Z9851 Tubal ligation status: Secondary | ICD-10-CM | POA: Insufficient documentation

## 2012-11-09 DIAGNOSIS — N760 Acute vaginitis: Secondary | ICD-10-CM | POA: Insufficient documentation

## 2012-11-09 DIAGNOSIS — K5900 Constipation, unspecified: Secondary | ICD-10-CM | POA: Insufficient documentation

## 2012-11-09 DIAGNOSIS — R1031 Right lower quadrant pain: Secondary | ICD-10-CM | POA: Insufficient documentation

## 2012-11-09 DIAGNOSIS — Z79899 Other long term (current) drug therapy: Secondary | ICD-10-CM | POA: Insufficient documentation

## 2012-11-09 DIAGNOSIS — B9689 Other specified bacterial agents as the cause of diseases classified elsewhere: Secondary | ICD-10-CM | POA: Insufficient documentation

## 2012-11-09 DIAGNOSIS — A499 Bacterial infection, unspecified: Secondary | ICD-10-CM | POA: Insufficient documentation

## 2012-11-09 DIAGNOSIS — R112 Nausea with vomiting, unspecified: Secondary | ICD-10-CM | POA: Insufficient documentation

## 2012-11-09 DIAGNOSIS — Z8669 Personal history of other diseases of the nervous system and sense organs: Secondary | ICD-10-CM | POA: Insufficient documentation

## 2012-11-09 DIAGNOSIS — R509 Fever, unspecified: Secondary | ICD-10-CM | POA: Insufficient documentation

## 2012-11-09 DIAGNOSIS — Z9089 Acquired absence of other organs: Secondary | ICD-10-CM | POA: Insufficient documentation

## 2012-11-09 DIAGNOSIS — R630 Anorexia: Secondary | ICD-10-CM | POA: Insufficient documentation

## 2012-11-09 DIAGNOSIS — Z862 Personal history of diseases of the blood and blood-forming organs and certain disorders involving the immune mechanism: Secondary | ICD-10-CM | POA: Insufficient documentation

## 2012-11-09 DIAGNOSIS — G43909 Migraine, unspecified, not intractable, without status migrainosus: Secondary | ICD-10-CM | POA: Insufficient documentation

## 2012-11-09 DIAGNOSIS — Z8739 Personal history of other diseases of the musculoskeletal system and connective tissue: Secondary | ICD-10-CM | POA: Insufficient documentation

## 2012-11-09 LAB — CBC WITH DIFFERENTIAL/PLATELET
Basophils Absolute: 0 10*3/uL (ref 0.0–0.1)
Basophils Relative: 0 % (ref 0–1)
HCT: 32.4 % — ABNORMAL LOW (ref 36.0–46.0)
Lymphocytes Relative: 30 % (ref 12–46)
MCHC: 33 g/dL (ref 30.0–36.0)
Monocytes Absolute: 0.5 10*3/uL (ref 0.1–1.0)
Neutro Abs: 3.2 10*3/uL (ref 1.7–7.7)
Platelets: 254 10*3/uL (ref 150–400)
RDW: 14.7 % (ref 11.5–15.5)
WBC: 5.5 10*3/uL (ref 4.0–10.5)

## 2012-11-09 LAB — BASIC METABOLIC PANEL
CO2: 22 mEq/L (ref 19–32)
Calcium: 9.1 mg/dL (ref 8.4–10.5)
Chloride: 105 mEq/L (ref 96–112)
Creatinine, Ser: 0.76 mg/dL (ref 0.50–1.10)
GFR calc Af Amer: >90 mL/min (ref >90)
Sodium: 138 mEq/L (ref 135–145)

## 2012-11-09 LAB — URINALYSIS, ROUTINE W REFLEX MICROSCOPIC
Ketones, ur: NEGATIVE mg/dL
Leukocytes, UA: NEGATIVE
Nitrite: NEGATIVE
Protein, ur: NEGATIVE mg/dL

## 2012-11-09 LAB — POCT PREGNANCY, URINE: Preg Test, Ur: NEGATIVE

## 2012-11-09 LAB — WET PREP, GENITAL: Yeast Wet Prep HPF POC: NONE SEEN

## 2012-11-09 MED ORDER — ONDANSETRON HCL 4 MG/2ML IJ SOLN
4.0000 mg | Freq: Once | INTRAMUSCULAR | Status: AC
  Administered 2012-11-09: 4 mg via INTRAVENOUS
  Filled 2012-11-09: qty 2

## 2012-11-09 MED ORDER — PROMETHAZINE HCL 25 MG PO TABS: 25.0000 mg | ORAL_TABLET | Freq: Four times a day (QID) | ORAL | Status: DC | PRN

## 2012-11-09 MED ORDER — POTASSIUM CHLORIDE CRYS ER 20 MEQ PO TBCR
40.0000 meq | EXTENDED_RELEASE_TABLET | Freq: Once | ORAL | Status: AC
  Administered 2012-11-09: 40 meq via ORAL
  Filled 2012-11-09: qty 2

## 2012-11-09 MED ORDER — SODIUM CHLORIDE 0.9 % IV BOLUS (SEPSIS)
1000.0000 mL | Freq: Once | INTRAVENOUS | Status: AC
  Administered 2012-11-09: 1000 mL via INTRAVENOUS

## 2012-11-09 MED ORDER — MORPHINE SULFATE 4 MG/ML IJ SOLN
4.0000 mg | Freq: Once | INTRAMUSCULAR | Status: AC
  Administered 2012-11-09: 4 mg via INTRAVENOUS
  Filled 2012-11-09: qty 1

## 2012-11-09 MED ORDER — FAMOTIDINE 40 MG/5ML PO SUSR: 20.0000 mg | Freq: Every day | ORAL | Status: DC

## 2012-11-09 MED ORDER — METRONIDAZOLE 500 MG PO TABS: 500.0000 mg | ORAL_TABLET | Freq: Two times a day (BID) | ORAL | Status: DC

## 2012-11-09 MED ORDER — RANITIDINE HCL 150 MG/10ML PO SYRP
300.0000 mg | ORAL_SOLUTION | Freq: Every day | ORAL | Status: DC
  Administered 2012-11-09: 300 mg via ORAL
  Filled 2012-11-09: qty 20

## 2012-11-09 MED ORDER — POLYETHYLENE GLYCOL 3350 17 G PO PACK
17.0000 g | PACK | Freq: Every day | ORAL | Status: DC
  Filled 2012-11-09: qty 1

## 2012-11-09 MED ORDER — SODIUM CHLORIDE 0.9 % IV SOLN
INTRAVENOUS | Status: DC
  Administered 2012-11-09: 18:00:00 via INTRAVENOUS

## 2012-11-09 NOTE — ED Provider Notes (Signed)
CSN: [id number]     Arrival date & time 11/09/12  1620 History   First MD Initiated Contact with Patient 11/09/12 1645     Chief Complaint  Patient presents with  . Abdominal Pain   (Consider location/radiation/quality/duration/timing/severity/associated sxs/prior Treatment) HPI  42 year old female with history of fibromyalgia, for migraine, prior surgical history of cholecystectomy and tubal ligation presents for abdominal pain. Patient reports intermittent right lower quadrant abdominal pain ongoing for nearly a week. Pain is described as a sharp crampy sensation, worsening with movement, palpation and improves with laying flat. When pain is severe patient feels nauseous and has occasional vomiting. She also endorsed a fever as high as 102 three days ago but that has improved. She has decrease in appetite. She has been feeling constipated for about 5 days but had a diarrhea last night. She has been using warm compress and rest with minimal relief. She does have vicodin at home but afraid to take it due to constipation. She was seen by her primary care Dr. yesterday had an abdominal CT scan to r/o appendicitis. CT did not show evidence of appendicitis and patient was referred to a surgeon to be seen on Monday. She is here today due to worsening pain, unable to wait for surgeon's appointment.  No pelvic exam performed at doctor's office.  Pt is sexually active.  Denies headache, neck stiffness, cp, sob, productive cough, back pain, dysuria, hematuria, vaginal discharge or rash.    Past Medical History  Diagnosis Date  . Migraines   . Fibromyalgia   . 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 Cholecystectomy    . Tubal ligation     No family history on file. History  Substance Use Topics  . Smoking status: Never Smoker   . Smokeless tobacco: Not on file  . Alcohol Use: No   OB History   Grav Para Term Preterm Abortions TAB  SAB Ect Mult Living                 Review of Systems  All other systems reviewed and are negative.    Allergies  Review of patient's allergies indicates no known allergies.  Home Medications   Current Outpatient Rx  Name  Route  Sig  Dispense  Refill  . gabapentin (NEURONTIN) 100 MG capsule   Oral   Take 300 mg by mouth at bedtime.         Marland Kitchen HYDROcodone-acetaminophen (NORCO) 7.5-325 MG per tablet   Oral   Take 0.5 tablets by mouth every 6 (six) hours as needed. For pain         . ibuprofen (ADVIL,MOTRIN) 200 MG tablet   Oral   Take 600 mg by mouth every 6 (six) hours as needed for pain.         Marland Kitchen lactobacillus acidophilus (BACID) TABS tablet   Oral   Take 2 tablets by mouth every morning.         . Misc Natural Products (IMMUNE FORMULA PO)   Oral   Take 2.5 mLs by mouth 2 (two) times daily. Sovereign Silver Immune Health         . promethazine (PHENERGAN) 25 MG tablet   Oral   Take 25 mg by mouth every 6 (six) hours as needed. For nausea         . topiramate (TOPAMAX) 100 MG tablet   Oral   Take 100  mg by mouth at bedtime.          LMP 10/15/2012 Physical Exam  Nursing note and vitals reviewed. Constitutional: She appears well-developed and well-nourished. No distress.  HENT:  Head: Normocephalic and atraumatic.  Eyes: Conjunctivae are normal.  Neck: Normal range of motion. Neck supple.  Cardiovascular: Normal rate and regular rhythm.   Pulmonary/Chest: Effort normal and breath sounds normal. She exhibits no tenderness.  Abdominal: Soft. She exhibits no distension. There is tenderness (moderate tenderness to suprapubic and RLQ on palpation with guarding.  ).  Genitourinary: Vagina normal and uterus normal. There is no rash or lesion on the right labia. There is no rash or lesion on the left labia. Cervix exhibits no motion tenderness and no discharge. Right adnexum displays tenderness. Right adnexum displays no mass. Left adnexum displays no mass  and no tenderness. No erythema, tenderness or bleeding around the vagina. No vaginal discharge found.  Chaperone present  Lymphadenopathy:       Right: No inguinal adenopathy present.       Left: No inguinal adenopathy present.    ED Course  Procedures (including critical care time)  5:53 PM Patient here with right adnexal tenderness and right lower quadrant abdominal pain. Pain is getting progressively worse. She had abdominal CT scan yesterday that shows a 70 mm cystic area along the anterior right pelvic, not compatible with hernia. Patient has tenderness to palpation on pelvic exam. Will obtain pelvic ultrasound for further evaluation. We'll continue with IV fluid and pain management.  9:16 PM Patient states she felt better after receiving medication here. Her pain has improved. She is able to tolerates by mouth. Her wet prep shows evidence suggestive of bacterial vaginosis. Flagyl given as treatment. Her potassium level is 3.2. Supplementation given. Ultrasound pelvis performed showing a focal endometrial thickening posterior fundus that could not exclude a polyp. I will give her referral to OB/GYN for further care. Otherwise ultrasounds unremarkable. Patient will followup with her surgeon on Monday. Return precautions given.  Labs Review Labs Reviewed  WET PREP, GENITAL - Abnormal; Notable for the following:    Clue Cells Wet Prep HPF POC MANY (*)    WBC, Wet Prep HPF POC FEW (*)    All other components within normal limits  CBC WITH DIFFERENTIAL - Abnormal; Notable for the following:    RBC 3.79 (*)    Hemoglobin 10.7 (*)    HCT 32.4 (*)    All other components within normal limits  BASIC METABOLIC PANEL - Abnormal; Notable for the following:    Potassium 3.2 (*)    All other components within normal limits  GC/CHLAMYDIA PROBE AMP  URINALYSIS, ROUTINE W REFLEX MICROSCOPIC  POCT PREGNANCY, URINE   Imaging Review US Transvaginal Non-ob  11/09/2012   *RADIOLOGY REPORT*   Clinical Data:  42 year old female with right pelvic pain.  TRANSABDOMINAL AND TRANSVAGINAL ULTRASOUND OF PELVIS DOPPLER ULTRASOUND OF OVARIES  Technique:  Both transabdominal and transvaginal ultrasound examinations of the pelvis were performed. Transabdominal technique was performed for global imaging of the pelvis including uterus, ovaries, adnexal regions, and pelvic cul-de-sac.  It was necessary to proceed with endovaginal exam following the transabdominal exam to visualize the adnexal regions and endometrium.  Color and duplex Doppler ultrasound was utilized to evaluate blood flow to the ovaries.  Comparison: 11/08/2012 CT  Findings:  Uterus:  The uterus is anteverted measuring 11 x 5.2 x 7.7 cm.  A 4 x 6 mm cyst in the fundal junctional zone  is noted and suspicious for adenomyosis.  No other uterine abnormalities are identified.  Endometrium: The endometrium measures 13 mm in greatest diameter. There is focal thickening towards the fundus measuring 5 x 5 x 11 mm and a polyp is not excluded.  Right ovary: The right ovary is unremarkable measuring 3.5 x 1.8 x 2.7 cm.  Normal color Doppler flow and arterial/venous waveforms noted.  Left ovary: The left ovary is unremarkable measuring 4.1 x 2.5 x 2.9 cm. Normal color Doppler flow and arterial/venous waveforms are present.  Pulsed Doppler evaluation demonstrates normal low-resistance arterial and venous waveforms in both ovaries.  A 2.6 x 2 cm left paraovarian cyst is present. A trace amount of free pelvic fluid is present. There is no evidence of solid adnexal mass.  IMPRESSION:  Focal endometrial thickening towards the fundus - polyp not excluded.  Consider further evaluation.  Uterine myometrial cyst - suspicious for adenomyosis.  Small amount of free pelvic fluid - question physiologic.  No sonographic evidence for ovarian torsion.   Original Report Authenticated By: Harmon Pier, M.D.   US Pelvis Complete  11/09/2012   *RADIOLOGY REPORT*  Clinical Data:   42 year old female with right pelvic pain.  TRANSABDOMINAL AND TRANSVAGINAL ULTRASOUND OF PELVIS DOPPLER ULTRASOUND OF OVARIES  Technique:  Both transabdominal and transvaginal ultrasound examinations of the pelvis were performed. Transabdominal technique was performed for global imaging of the pelvis including uterus, ovaries, adnexal regions, and pelvic cul-de-sac.  It was necessary to proceed with endovaginal exam following the transabdominal exam to visualize the adnexal regions and endometrium.  Color and duplex Doppler ultrasound was utilized to evaluate blood flow to the ovaries.  Comparison: 11/08/2012 CT  Findings:  Uterus:  The uterus is anteverted measuring 11 x 5.2 x 7.7 cm.  A 4 x 6 mm cyst in the fundal junctional zone is noted and suspicious for adenomyosis.  No other uterine abnormalities are identified.  Endometrium: The endometrium measures 13 mm in greatest diameter. There is focal thickening towards the fundus measuring 5 x 5 x 11 mm and a polyp is not excluded.  Right ovary: The right ovary is unremarkable measuring 3.5 x 1.8 x 2.7 cm.  Normal color Doppler flow and arterial/venous waveforms noted.  Left ovary: The left ovary is unremarkable measuring 4.1 x 2.5 x 2.9 cm. Normal color Doppler flow and arterial/venous waveforms are present.  Pulsed Doppler evaluation demonstrates normal low-resistance arterial and venous waveforms in both ovaries.  A 2.6 x 2 cm left paraovarian cyst is present. A trace amount of free pelvic fluid is present. There is no evidence of solid adnexal mass.  IMPRESSION:  Focal endometrial thickening towards the fundus - polyp not excluded.  Consider further evaluation.  Uterine myometrial cyst - suspicious for adenomyosis.  Small amount of free pelvic fluid - question physiologic.  No sonographic evidence for ovarian torsion.   Original Report Authenticated By: Harmon Pier, M.D.   Ct Abdomen Pelvis W Contrast  11/08/2012   CLINICAL DATA:  Right lower quadrant  abdominal pain. Question appendicitis.  EXAM: CT ABDOMEN AND PELVIS WITH CONTRAST  TECHNIQUE: Multidetector CT imaging of the abdomen and pelvis was performed using the standard protocol following bolus administration of intravenous contrast.  CONTRAST:  50mL OMNIPAQUE IOHEXOL 300 MG/ML SOLN, OMNIPAQUE IOHEXOL 300 MG/ML SOLN  COMPARISON:  Acute abdominal series 07/15/2011.  FINDINGS: The lung bases are clear without focal nodule, mass, or airspace disease.  The heart size is normal. There is no significant pleural or  pericardial effusion.  Mild intra and extra hepatic biliary dilation is present following cholecystectomy. A benign appearing cyst is present in the far left lobe of the liver on image 11 of series 2. No other discrete hepatic lesions are present. Spleen is within normal limits. The stomach, duodenum, and pancreas are within normal limits. The adrenal glands are normal bilaterally. A benign appearing cyst in the posterior left kidney measures 13.5 mm. Kidneys are otherwise unremarkable. The right ureter is mildly dilated without an obstructing lesion. The urinary bladder is unremarkable.  Rectus sigmoid colon is within normal limits. Moderate stool is present throughout the colon. The appendix is visualized and normal. A 1.7 cm cystic lesion is noted along the right anterior pelvis common just anterior to the anterior abdominal wall on image 17 of series 2.  The uterus is somewhat heterogeneous suggesting multiple small fibroids. A 2.1 cm peripherally enhancing cystic lesion in the left ovary likely represents a recently ruptured follicle. The adnexa are otherwise within normal limits. A minimal amount of free fluid is likely physiologic. There is no significant adenopathy.  Bone windows demonstrate no focal lytic or blastic lesions.  IMPRESSION: 1. Normal appearance of the appendix. 2. Benign appearing cysts in the left lobe of the liver and left kidney. 3. Moderate stool throughout the colon. 4.  17 mm cystic area along the anterior right pelvis likely represents fluid within the right inguinal canal not compatible with a hernia. This could be source of the patient's pain. 5. 2.1 cm peripherally enhancing cystic lesion in the left ovary likely represents a recently ruptured follicle.   Electronically Signed   By: Gennette Pac   On: 11/08/2012 16:42   [place] Art/ven Flow Abd Pelv Doppler  11/09/2012   *RADIOLOGY REPORT*  Clinical Data:  42 year old female with right pelvic pain.  TRANSABDOMINAL AND TRANSVAGINAL ULTRASOUND OF PELVIS DOPPLER ULTRASOUND OF OVARIES  Technique:  Both transabdominal and transvaginal ultrasound examinations of the pelvis were performed. Transabdominal technique was performed for global imaging of the pelvis including uterus, ovaries, adnexal regions, and pelvic cul-de-sac.  It was necessary to proceed with endovaginal exam following the transabdominal exam to visualize the adnexal regions and endometrium.  Color and duplex Doppler ultrasound was utilized to evaluate blood flow to the ovaries.  Comparison: 11/08/2012 CT  Findings:  Uterus:  The uterus is anteverted measuring 11 x 5.2 x 7.7 cm.  A 4 x 6 mm cyst in the fundal junctional zone is noted and suspicious for adenomyosis.  No other uterine abnormalities are identified.  Endometrium: The endometrium measures 13 mm in greatest diameter. There is focal thickening towards the fundus measuring 5 x 5 x 11 mm and a polyp is not excluded.  Right ovary: The right ovary is unremarkable measuring 3.5 x 1.8 x 2.7 cm.  Normal color Doppler flow and arterial/venous waveforms noted.  Left ovary: The left ovary is unremarkable measuring 4.1 x 2.5 x 2.9 cm. Normal color Doppler flow and arterial/venous waveforms are present.  Pulsed Doppler evaluation demonstrates normal low-resistance arterial and venous waveforms in both ovaries.  A 2.6 x 2 cm left paraovarian cyst is present. A trace amount of free pelvic fluid is present. There is no  evidence of solid adnexal mass.  IMPRESSION:  Focal endometrial thickening towards the fundus - polyp not excluded.  Consider further evaluation.  Uterine myometrial cyst - suspicious for adenomyosis.  Small amount of free pelvic fluid - question physiologic.  No sonographic evidence for ovarian torsion.  Original Report Authenticated By: Harmon Pier, M.D.    MDM   1. Abdominal pain, bilateral lower quadrant   2. BV (bacterial vaginosis)    BP 112/45  Pulse 77  Temp(Src) 98.6 F (37 C) (Oral)  Resp 14  Ht 5\' 3"  (1.6 m)  Wt 135 lb (61.236 kg)  BMI 23.92 kg/m2  SpO2 100%  LMP 10/15/2012  I have reviewed nursing notes and vital signs. I personally reviewed the imaging tests through PACS system  I reviewed available ER/hospitalization records thought the EMR     Fayrene Helper, [place] 11/09/12 2117

## 2012-11-09 NOTE — ED Notes (Signed)
Pt has returned today w/continued abdominal pain.  States she had a CT done here yesterday and has a fluid filled cyst in RLQ and is supposed to f/u w/a surgeon on Monday.  PT states she can't wait until them b/c the pain is too bad.  Also had diarrhea all last night following being constipated for about 5 days.  Has had been running a fever 3 days ago for 2 days.

## 2012-11-09 NOTE — ED Provider Notes (Signed)
Medical screening examination/treatment/procedure(s) were performed by non-physician practitioner and as supervising physician I was immediately available for consultation/collaboration.  Akira Adelsberger T Toleen Lachapelle, MD 11/09/12 2315 

## 2012-11-10 LAB — GC/CHLAMYDIA PROBE AMP: CT Probe RNA: NEGATIVE

## 2012-11-11 ENCOUNTER — Encounter (INDEPENDENT_AMBULATORY_CARE_PROVIDER_SITE_OTHER): Payer: Self-pay | Admitting: Surgery

## 2012-11-12 ENCOUNTER — Encounter (HOSPITAL_COMMUNITY): Payer: Self-pay | Admitting: Pharmacy Technician

## 2012-11-12 ENCOUNTER — Encounter (INDEPENDENT_AMBULATORY_CARE_PROVIDER_SITE_OTHER): Payer: Self-pay | Admitting: Surgery

## 2012-11-12 ENCOUNTER — Ambulatory Visit (INDEPENDENT_AMBULATORY_CARE_PROVIDER_SITE_OTHER): Payer: Self-pay | Admitting: Surgery

## 2012-11-12 VITALS — BP 110/68 | HR 84 | Temp 98.2°F | Resp 14 | Ht 63.0 in | Wt 136.4 lb

## 2012-11-12 DIAGNOSIS — R1909 Other intra-abdominal and pelvic swelling, mass and lump: Secondary | ICD-10-CM | POA: Insufficient documentation

## 2012-11-12 DIAGNOSIS — R1903 Right lower quadrant abdominal swelling, mass and lump: Secondary | ICD-10-CM

## 2012-11-12 NOTE — Progress Notes (Signed)
Patient ID: Elizabeth Kennedy, female   DOB: Dec 05, 1970, 42 y.o.   MRN: [id number]  Chief Complaint  Patient presents with  . New Evaluation    eval fluid filled cyst in ing canal    HPI Elizabeth Kennedy is a 42 y.o. female.   HPI This is a pleasant female referred to me by Dr. Hildred Alamin for evaluation of Right groin pain. She has been hurting intermittently in her groin for last 6 months. However last 10 days it is becoming more painful. She has noticed a bulge in the self right groin.  She has no obstructive symptoms. The pain is sharp, moderate, and is now referred anywhere else. It is also now continuous. Past Medical History  Diagnosis Date  . Migraines   . Fibromyalgia   . 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 Anemia     Past Surgical History  Procedure Laterality Date  . Cholecystectomy    . Tubal ligation      History reviewed. No pertinent family history.  Social History History  Substance Use Topics  . Smoking status: Never Smoker   . Smokeless tobacco: Never Used  . Alcohol Use: No    No Known Allergies  Current Outpatient Prescriptions  Medication Sig Dispense Refill  . gabapentin (NEURONTIN) 100 MG capsule Take 300 mg by mouth at bedtime.      Marland Kitchen HYDROcodone-acetaminophen (NORCO) 7.5-325 MG per tablet Take 0.5 tablets by mouth every 6 (six) hours as needed. For pain      . ibuprofen (ADVIL,MOTRIN) 200 MG tablet Take 600 mg by mouth every 6 (six) hours as needed for pain.      . meperidine (DEMEROL) 50 MG tablet Take 50 mg by mouth every 4 (four) hours as needed for pain.      . metroNIDAZOLE (FLAGYL) 500 MG tablet Take 500 mg by mouth 3 (three) times daily.      . Misc Natural Products (IMMUNE FORMULA PO) Take 2.5 mLs by mouth 2 (two) times daily. Sovereign Silver Immune Health      . promethazine (PHENERGAN) 25 MG tablet Take 1 tablet (25 mg total) by mouth every 6 (six) hours as needed. For nausea  30 tablet  0  . topiramate (TOPAMAX) 100 MG tablet  Take 100 mg by mouth at bedtime.       No current facility-administered medications for this visit.    Review of Systems Review of Systems  Constitutional: Positive for fever and chills. Negative for unexpected weight change.  HENT: Positive for congestion. Negative for hearing loss, sore throat, trouble swallowing and voice change.   Eyes: Negative for visual disturbance.  Respiratory: Positive for cough. Negative for wheezing.   Cardiovascular: Positive for chest pain and leg swelling. Negative for palpitations.  Gastrointestinal: Positive for abdominal pain and abdominal distention. Negative for nausea, vomiting, diarrhea, constipation, blood in stool and anal bleeding.  Genitourinary: Negative for hematuria, vaginal bleeding and difficulty urinating.  Musculoskeletal: Negative for arthralgias.  Skin: Negative for rash and wound.  Neurological: Negative for seizures, syncope and headaches.  Hematological: Negative for adenopathy. Does not bruise/bleed easily.  Psychiatric/Behavioral: Negative for confusion.    Blood pressure 110/68, pulse 84, temperature 98.2 F (36.8 C), temperature source Temporal, resp. rate 14, height 5\' 3"  (1.6 m), weight 136 lb 6.4 oz (61.871 kg), last menstrual period 10/15/2012.  Physical Exam Physical Exam  Constitutional: She is oriented to person, place, and time. She appears well-developed  and well-nourished. No distress.  HENT:  Head: Normocephalic and atraumatic.  Right Ear: External ear normal.  Left Ear: External ear normal.  Nose: Nose normal.  Mouth/Throat: Oropharynx is clear and moist. No oropharyngeal exudate.  Eyes: Conjunctivae are normal. Pupils are equal, round, and reactive to light. Right eye exhibits no discharge. Left eye exhibits no discharge. No scleral icterus.  Neck: Normal range of motion. Neck supple. No tracheal deviation present. No thyromegaly present.  Cardiovascular: Normal rate, regular rhythm, normal heart sounds and  intact distal pulses.   No murmur heard. Pulmonary/Chest: Effort normal and breath sounds normal. She has no wheezes.  Abdominal: Soft. Bowel sounds are normal. She exhibits no distension.  There is a tender 1-1/2-2 cm mass in her right groin below the right inguinal ligament. There are no skin changes  Musculoskeletal: Normal range of motion. She exhibits no edema and no tenderness.  Lymphadenopathy:    She has no cervical adenopathy.  Neurological: She is alert and oriented to person, place, and time.  Skin: Skin is warm and dry. No rash noted. No erythema.  Psychiatric: Her behavior is normal. Judgment normal.    Data Reviewed I have reviewed the CAT scan of her abdomen and pelvis showing a 1.7 cm cyst in the right groin of uncertain etiology  Assessment    Right groin mass     Plan    I believe this may represent either a necrotic lymph node or a femoral hernia sac. Because of her tenderness, exploration and removal of this area is recommended. I explained to them the diagnosis. I explained that mesh may be used to repair this area if a femoral hernia is present. I discussed the other risks which includes but is not limited to bleeding, infection, recurrence, chronic pain, seroma formation, nerve entrapment, need for further surgery, et Karie Soda. They understand and wish to proceed        Vanetta Rule A 11/12/2012, 10:06 AM

## 2012-11-15 ENCOUNTER — Encounter (HOSPITAL_COMMUNITY)
Admission: RE | Admit: 2012-11-15 | Discharge: 2012-11-15 | Disposition: A | Discharge status: Home / Self Care | Payer: Self-pay | Source: Ambulatory Visit | Attending: Surgery | Admitting: Surgery

## 2012-11-15 ENCOUNTER — Encounter (HOSPITAL_COMMUNITY): Payer: Self-pay

## 2012-11-15 DIAGNOSIS — Z01818 Encounter for other preprocedural examination: Secondary | ICD-10-CM | POA: Insufficient documentation

## 2012-11-15 DIAGNOSIS — Z01812 Encounter for preprocedural laboratory examination: Secondary | ICD-10-CM | POA: Insufficient documentation

## 2012-11-15 LAB — BASIC METABOLIC PANEL
Calcium: 9.4 mg/dL (ref 8.4–10.5)
Creatinine, Ser: 0.75 mg/dL (ref 0.50–1.10)
GFR calc non Af Amer: >90 mL/min (ref >90)
Glucose, Bld: 92 mg/dL (ref 70–99)
Sodium: 137 mEq/L (ref 135–145)

## 2012-11-15 LAB — HCG, SERUM, QUALITATIVE: Preg, Serum: NEGATIVE

## 2012-11-15 NOTE — Progress Notes (Signed)
Chest x ray, EKG 12/13 EPIC,   CBC with diff, BMET, URINE PREGNANCY TEST 11/09/12 epic

## 2012-11-15 NOTE — Patient Instructions (Addendum)
20 Elizabeth Kennedy  11/15/2012   Your procedure is scheduled on:  11/22/12   Friday  Report to [facility] at  0550     AM.  Call this number if you have problems the morning of surgery: [phone]       Remember:   Do not eat food  Or drink :After Midnight. Thursday NIGHT   Take these medicines the morning of surgery with A SIP OF WATER:May take hydrocodone, or meperidine or promethazine if needed   .  Contacts, dentures or partial plates can not be worn to surgery  Leave suitcase in the car. After surgery it may be brought to your room.  For patients admitted to the hospital, checkout time is 11:00 AM day of  discharge.             SPECIAL INSTRUCTIONS- SEE [place] PREPARING FOR SURGERY INSTRUCTION SHEET-     DO NOT WEAR JEWELRY, LOTIONS, POWDERS, OR PERFUMES.  WOMEN-- DO NOT SHAVE LEGS OR UNDERARMS FOR 12 HOURS BEFORE SHOWERS. MEN MAY SHAVE FACE.  Patients discharged the day of surgery will not be allowed to drive home. IF going home the day of surgery, you must have a driver and someone to stay with you for the first 24 hours  Name and phone number of your driver:       SON                                                                                                                                                  Elizabeth Kennedy  PST [phone]  [phone]                 FAILURE TO FOLLOW THESE INSTRUCTIONS MAY RESULT IN  CANCELLATION   OF YOUR SURGERY                                                  Patient Signature _____________________________

## 2012-11-21 NOTE — H&P (Signed)
Patient ID: Elizabeth Kennedy, female DOB: [birth year], 42 y.o. MRN: [id number]  Chief Complaint   Patient presents with   .  New Evaluation     eval fluid filled cyst in ing canal   HPI  Elizabeth Kennedy is a 42 y.o. female.  HPI  This is a pleasant female referred to me by Dr. Hildred Alamin for evaluation of Right groin pain. She has been hurting intermittently in her groin for last 6 months. However last 10 days it is becoming more painful. She has noticed a bulge in the self right groin. She has no obstructive symptoms. The pain is sharp, moderate, and is now referred anywhere else. It is also now continuous.  Past Medical History   Diagnosis  Date   .  Migraines    .  Fibromyalgia    .  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  Anemia     Past Surgical History   Procedure  Laterality  Date   .  Cholecystectomy     .  Tubal ligation     History reviewed. No pertinent family history.  Social History  History   Substance Use Topics   .  Smoking status:  Never Smoker   .  Smokeless tobacco:  Never Used   .  Alcohol Use:  No   No Known Allergies  Current Outpatient Prescriptions   Medication  Sig  Dispense  Refill   .  gabapentin (NEURONTIN) 100 MG capsule  Take 300 mg by mouth at bedtime.     Marland Kitchen  HYDROcodone-acetaminophen (NORCO) 7.5-325 MG per tablet  Take 0.5 tablets by mouth every 6 (six) hours as needed. For pain     .  ibuprofen (ADVIL,MOTRIN) 200 MG tablet  Take 600 mg by mouth every 6 (six) hours as needed for pain.     .  meperidine (DEMEROL) 50 MG tablet  Take 50 mg by mouth every 4 (four) hours as needed for pain.     .  metroNIDAZOLE (FLAGYL) 500 MG tablet  Take 500 mg by mouth 3 (three) times daily.     .  Misc Natural Products (IMMUNE FORMULA PO)  Take 2.5 mLs by mouth 2 (two) times daily. Sovereign Silver Immune Health     .  promethazine (PHENERGAN) 25 MG tablet  Take 1 tablet (25 mg total) by mouth every 6 (six) hours as needed. For nausea  30 tablet  0   .   topiramate (TOPAMAX) 100 MG tablet  Take 100 mg by mouth at bedtime.      No current facility-administered medications for this visit.   Review of Systems  Review of Systems  Constitutional: Positive for fever and chills. Negative for unexpected weight change.  HENT: Positive for congestion. Negative for hearing loss, sore throat, trouble swallowing and voice change.  Eyes: Negative for visual disturbance.  Respiratory: Positive for cough. Negative for wheezing.  Cardiovascular: Positive for chest pain and leg swelling. Negative for palpitations.  Gastrointestinal: Positive for abdominal pain and abdominal distention. Negative for nausea, vomiting, diarrhea, constipation, blood in stool and anal bleeding.  Genitourinary: Negative for hematuria, vaginal bleeding and difficulty urinating.  Musculoskeletal: Negative for arthralgias.  Skin: Negative for rash and wound.  Neurological: Negative for seizures, syncope and headaches.  Hematological: Negative for adenopathy. Does not bruise/bleed easily.  Psychiatric/Behavioral: Negative for confusion.  Blood pressure 110/68, pulse 84, temperature 98.2 F (36.8 C), temperature source Temporal,  resp. rate 14, height 5\' 3"  (1.6 m), weight 136 lb 6.4 oz (61.871 kg), last menstrual period 10/15/2012.  Physical Exam  Physical Exam  Constitutional: She is oriented to person, place, and time. She appears well-developed and well-nourished. No distress.  HENT:  Head: Normocephalic and atraumatic.  Right Ear: External ear normal.  Left Ear: External ear normal.  Nose: Nose normal.  Mouth/Throat: Oropharynx is clear and moist. No oropharyngeal exudate.  Eyes: Conjunctivae are normal. Pupils are equal, round, and reactive to light. Right eye exhibits no discharge. Left eye exhibits no discharge. No scleral icterus.  Neck: Normal range of motion. Neck supple. No tracheal deviation present. No thyromegaly present.  Cardiovascular: Normal rate, regular rhythm,  normal heart sounds and intact distal pulses.  No murmur heard.  Pulmonary/Chest: Effort normal and breath sounds normal. She has no wheezes.  Abdominal: Soft. Bowel sounds are normal. She exhibits no distension.  There is a tender 1-1/2-2 cm mass in her right groin below the right inguinal ligament. There are no skin changes  Musculoskeletal: Normal range of motion. She exhibits no edema and no tenderness.  Lymphadenopathy:  She has no cervical adenopathy.  Neurological: She is alert and oriented to person, place, and time.  Skin: Skin is warm and dry. No rash noted. No erythema.  Psychiatric: Her behavior is normal. Judgment normal.  Data Reviewed  I have reviewed the CAT scan of her abdomen and pelvis showing a 1.7 cm cyst in the right groin of uncertain etiology  Assessment  Right groin mass  Plan  I believe this may represent either a necrotic lymph node or a femoral hernia sac. Because of her tenderness, exploration and removal of this area is recommended. I explained to them the diagnosis. I explained that mesh may be used to repair this area if a femoral hernia is present. I discussed the other risks which includes but is not limited to bleeding, infection, recurrence, chronic pain, seroma formation, nerve entrapment, need for further surgery, et Karie Soda. They understand and wish to proceed

## 2012-11-22 ENCOUNTER — Ambulatory Visit (HOSPITAL_COMMUNITY)
Admission: RE | Admit: 2012-11-22 | Discharge: 2012-11-22 | Disposition: A | Discharge status: Home / Self Care | Payer: Self-pay | Source: Ambulatory Visit | Attending: Surgery | Admitting: Surgery

## 2012-11-22 ENCOUNTER — Encounter (HOSPITAL_COMMUNITY): Payer: Self-pay | Admitting: Anesthesiology

## 2012-11-22 ENCOUNTER — Encounter (HOSPITAL_COMMUNITY)
Admission: RE | Disposition: A | Discharge status: Home / Self Care | Payer: Self-pay | Source: Ambulatory Visit | Attending: Surgery

## 2012-11-22 ENCOUNTER — Encounter (HOSPITAL_COMMUNITY): Payer: Self-pay | Admitting: *Deleted

## 2012-11-22 ENCOUNTER — Ambulatory Visit (HOSPITAL_COMMUNITY): Payer: Self-pay | Admitting: Anesthesiology

## 2012-11-22 DIAGNOSIS — R599 Enlarged lymph nodes, unspecified: Secondary | ICD-10-CM | POA: Insufficient documentation

## 2012-11-22 DIAGNOSIS — R1903 Right lower quadrant abdominal swelling, mass and lump: Secondary | ICD-10-CM | POA: Insufficient documentation

## 2012-11-22 DIAGNOSIS — Z79899 Other long term (current) drug therapy: Secondary | ICD-10-CM | POA: Insufficient documentation

## 2012-11-22 DIAGNOSIS — IMO0001 Reserved for inherently not codable concepts without codable children: Secondary | ICD-10-CM | POA: Insufficient documentation

## 2012-11-22 DIAGNOSIS — Z9851 Tubal ligation status: Secondary | ICD-10-CM | POA: Insufficient documentation

## 2012-11-22 DIAGNOSIS — Z9089 Acquired absence of other organs: Secondary | ICD-10-CM | POA: Insufficient documentation

## 2012-11-22 DIAGNOSIS — G43909 Migraine, unspecified, not intractable, without status migrainosus: Secondary | ICD-10-CM | POA: Insufficient documentation

## 2012-11-22 DIAGNOSIS — D649 Anemia, unspecified: Secondary | ICD-10-CM | POA: Insufficient documentation

## 2012-11-22 DIAGNOSIS — R209 Unspecified disturbances of skin sensation: Secondary | ICD-10-CM | POA: Insufficient documentation

## 2012-11-22 HISTORY — PX: MASS EXCISION: SHX2000

## 2012-11-22 SURGERY — EXCISION MASS
Anesthesia: General | Site: Groin | Laterality: Right | Wound class: Clean

## 2012-11-22 MED ORDER — FENTANYL CITRATE 0.05 MG/ML IJ SOLN
INTRAMUSCULAR | Status: AC
  Filled 2012-11-22: qty 2

## 2012-11-22 MED ORDER — LACTATED RINGERS IV SOLN: INTRAVENOUS | Status: DC

## 2012-11-22 MED ORDER — PROPOFOL 10 MG/ML IV BOLUS
INTRAVENOUS | Status: DC | PRN
  Administered 2012-11-22: 150 mg via INTRAVENOUS

## 2012-11-22 MED ORDER — MIDAZOLAM HCL 5 MG/5ML IJ SOLN
INTRAMUSCULAR | Status: DC | PRN
  Administered 2012-11-22: 1 mg via INTRAVENOUS

## 2012-11-22 MED ORDER — SODIUM CHLORIDE 0.9 % IR SOLN
Status: DC | PRN
  Administered 2012-11-22: 100 mL

## 2012-11-22 MED ORDER — BUPIVACAINE-EPINEPHRINE PF 0.25-1:200000 % IJ SOLN
INTRAMUSCULAR | Status: AC
  Filled 2012-11-22: qty 30

## 2012-11-22 MED ORDER — CEFAZOLIN SODIUM-DEXTROSE 2-3 GM-% IV SOLR
2.0000 g | INTRAVENOUS | Status: AC
  Administered 2012-11-22: 2 g via INTRAVENOUS

## 2012-11-22 MED ORDER — ACETAMINOPHEN 325 MG PO TABS: 650.0000 mg | ORAL_TABLET | ORAL | Status: DC | PRN

## 2012-11-22 MED ORDER — CEFAZOLIN SODIUM-DEXTROSE 2-3 GM-% IV SOLR
INTRAVENOUS | Status: AC
  Filled 2012-11-22: qty 50

## 2012-11-22 MED ORDER — LACTATED RINGERS IV SOLN
INTRAVENOUS | Status: DC
  Administered 2012-11-22: 1000 mL via INTRAVENOUS

## 2012-11-22 MED ORDER — SODIUM CHLORIDE 0.9 % IJ SOLN: 3.0000 mL | INTRAMUSCULAR | Status: DC | PRN

## 2012-11-22 MED ORDER — LACTATED RINGERS IV SOLN
INTRAVENOUS | Status: DC | PRN
  Administered 2012-11-22: 08:00:00 via INTRAVENOUS

## 2012-11-22 MED ORDER — FENTANYL CITRATE 0.05 MG/ML IJ SOLN: 25.0000 ug | INTRAMUSCULAR | Status: DC | PRN

## 2012-11-22 MED ORDER — OXYCODONE-ACETAMINOPHEN 10-325 MG PO TABS: 1.0000 | ORAL_TABLET | ORAL | Status: DC | PRN

## 2012-11-22 MED ORDER — LIDOCAINE HCL 1 % IJ SOLN
INTRAMUSCULAR | Status: DC | PRN
  Administered 2012-11-22: 60 mg via INTRADERMAL

## 2012-11-22 MED ORDER — SODIUM CHLORIDE 0.9 % IV SOLN: 250.0000 mL | INTRAVENOUS | Status: DC | PRN

## 2012-11-22 MED ORDER — BUPIVACAINE-EPINEPHRINE 0.25% -1:200000 IJ SOLN
INTRAMUSCULAR | Status: AC
  Filled 2012-11-22: qty 1

## 2012-11-22 MED ORDER — FENTANYL CITRATE 0.05 MG/ML IJ SOLN
25.0000 ug | INTRAMUSCULAR | Status: DC | PRN
  Administered 2012-11-22 (×2): 50 ug via INTRAVENOUS

## 2012-11-22 MED ORDER — FENTANYL CITRATE 0.05 MG/ML IJ SOLN: 50.0000 ug | INTRAMUSCULAR | Status: DC | PRN

## 2012-11-22 MED ORDER — ACETAMINOPHEN 650 MG RE SUPP
650.0000 mg | RECTAL | Status: DC | PRN
  Filled 2012-11-22: qty 1

## 2012-11-22 MED ORDER — SODIUM CHLORIDE 0.9 % IJ SOLN: 3.0000 mL | Freq: Two times a day (BID) | INTRAMUSCULAR | Status: DC

## 2012-11-22 MED ORDER — BUPIVACAINE HCL (PF) 0.5 % IJ SOLN
INTRAMUSCULAR | Status: DC | PRN
  Administered 2012-11-22: 20 mL

## 2012-11-22 MED ORDER — KETOROLAC TROMETHAMINE 30 MG/ML IJ SOLN
INTRAMUSCULAR | Status: DC | PRN
  Administered 2012-11-22: 30 mg via INTRAVENOUS

## 2012-11-22 MED ORDER — ONDANSETRON HCL 4 MG/2ML IJ SOLN
INTRAMUSCULAR | Status: DC | PRN
  Administered 2012-11-22: 4 mg via INTRAMUSCULAR

## 2012-11-22 MED ORDER — ONDANSETRON HCL 4 MG/2ML IJ SOLN: 4.0000 mg | Freq: Four times a day (QID) | INTRAMUSCULAR | Status: DC | PRN

## 2012-11-22 MED ORDER — FENTANYL CITRATE 0.05 MG/ML IJ SOLN
INTRAMUSCULAR | Status: DC | PRN
  Administered 2012-11-22: 50 ug via INTRAVENOUS

## 2012-11-22 MED ORDER — OXYCODONE HCL 5 MG PO TABS: 5.0000 mg | ORAL_TABLET | ORAL | Status: DC | PRN

## 2012-11-22 SURGICAL SUPPLY — 39 items
BLADE HEX COATED 2.75 (ELECTRODE) ×2 IMPLANT
BLADE SURG 15 STRL LF DISP TIS (BLADE) ×1 IMPLANT
BLADE SURG 15 STRL SS (BLADE) ×1
BLADE SURG SZ10 CARB STEEL (BLADE) ×2 IMPLANT
CANISTER SUCTION 2500CC (MISCELLANEOUS) ×2 IMPLANT
CHLORAPREP W/TINT 26ML (MISCELLANEOUS) ×2 IMPLANT
CLOTH BEACON ORANGE TIMEOUT ST (SAFETY) IMPLANT
DECANTER SPIKE VIAL GLASS SM (MISCELLANEOUS) ×2 IMPLANT
DERMABOND ADVANCED (GAUZE/BANDAGES/DRESSINGS)
DERMABOND ADVANCED .7 DNX12 (GAUZE/BANDAGES/DRESSINGS) IMPLANT
DRAIN PENROSE 18X1/2 LTX STRL (DRAIN) IMPLANT
DRAPE LAPAROTOMY TRNSV 102X78 (DRAPE) ×2 IMPLANT
DRSG TEGADERM 4X4.75 (GAUZE/BANDAGES/DRESSINGS) ×2 IMPLANT
ELECT REM PT RETURN 9FT ADLT (ELECTROSURGICAL) ×2
ELECTRODE REM PT RTRN 9FT ADLT (ELECTROSURGICAL) ×1 IMPLANT
GAUZE SPONGE 4X4 16PLY XRAY LF (GAUZE/BANDAGES/DRESSINGS) IMPLANT
GLOVE SURG SIGNA 7.5 PF LTX (GLOVE) ×4 IMPLANT
GOWN PREVENTION PLUS LG XLONG (DISPOSABLE) ×2 IMPLANT
GOWN STRL REIN XL XLG (GOWN DISPOSABLE) ×4 IMPLANT
KIT BASIN OR (CUSTOM PROCEDURE TRAY) ×2 IMPLANT
NEEDLE HYPO 22GX1.5 SAFETY (NEEDLE) IMPLANT
NEEDLE HYPO 25X1 1.5 SAFETY (NEEDLE) ×2 IMPLANT
NS IRRIG 1000ML POUR BTL (IV SOLUTION) ×2 IMPLANT
PACK BASIC VI WITH GOWN DISP (CUSTOM PROCEDURE TRAY) ×2 IMPLANT
PENCIL BUTTON HOLSTER BLD 10FT (ELECTRODE) ×2 IMPLANT
SPONGE GAUZE 4X4 12PLY (GAUZE/BANDAGES/DRESSINGS) ×2 IMPLANT
SPONGE LAP 4X18 X RAY DECT (DISPOSABLE) ×2 IMPLANT
STRIP CLOSURE SKIN 1/2X4 (GAUZE/BANDAGES/DRESSINGS) IMPLANT
SUT MNCRL AB 4-0 PS2 18 (SUTURE) ×2 IMPLANT
SUT VIC AB 2-0 CT1 27 (SUTURE)
SUT VIC AB 2-0 CT1 TAPERPNT 27 (SUTURE) IMPLANT
SUT VIC AB 3-0 54XBRD REEL (SUTURE) IMPLANT
SUT VIC AB 3-0 BRD 54 (SUTURE)
SUT VIC AB 3-0 SH 27 (SUTURE)
SUT VIC AB 3-0 SH 27XBRD (SUTURE) IMPLANT
SYR BULB IRRIGATION 50ML (SYRINGE) IMPLANT
SYR CONTROL 10ML LL (SYRINGE) ×2 IMPLANT
TOWEL OR 17X26 10 PK STRL BLUE (TOWEL DISPOSABLE) ×2 IMPLANT
YANKAUER SUCT BULB TIP 10FT TU (MISCELLANEOUS) ×2 IMPLANT

## 2012-11-22 NOTE — Interval H&P Note (Signed)
History and Physical Interval Note: no change in H and P  11/22/2012 6:58 AM  Elizabeth Kennedy  has presented today for surgery, with the diagnosis of mass  The various methods of treatment have been discussed with the patient and family. After consideration of risks, benefits and other options for treatment, the patient has consented to  Procedure(s): EXCISION RIGHT GROIN MASS  (Right) as a surgical intervention .  The patient's history has been reviewed, patient examined, no change in status, stable for surgery.  I have reviewed the patient's chart and labs.  Questions were answered to the patient's satisfaction.     Skai Lickteig A

## 2012-11-22 NOTE — Anesthesia Preprocedure Evaluation (Addendum)
Anesthesia Evaluation  Patient identified by MRN, date of birth, ID band Patient awake    Reviewed: Allergy & Precautions, H&P , NPO status , Patient's Chart, lab work & pertinent test results  Airway Mallampati: II TM Distance: >3 FB Neck ROM: full    Dental no notable dental hx. (+) Teeth Intact and Dental Advisory Given   Pulmonary neg pulmonary ROS,  breath sounds clear to auscultation  Pulmonary exam normal       Cardiovascular Exercise Tolerance: Good negative cardio ROS  Rhythm:regular Rate:Normal     Neuro/Psych  Headaches, negative neurological ROS  negative psych ROS   GI/Hepatic negative GI ROS, Neg liver ROS,   Endo/Other  negative endocrine ROS  Renal/GU negative Renal ROS  negative genitourinary   Musculoskeletal  (+) Fibromyalgia -  Abdominal   Peds  Hematology negative hematology ROS (+)   Anesthesia Other Findings   Reproductive/Obstetrics negative OB ROS                           Anesthesia Physical Anesthesia Plan  ASA: II  Anesthesia Plan: General   Post-op Pain Management:    Induction: Intravenous  Airway Management Planned: LMA  Additional Equipment:   Intra-op Plan:   Post-operative Plan:   Informed Consent: I have reviewed the patients History and Physical, chart, labs and discussed the procedure including the risks, benefits and alternatives for the proposed anesthesia with the patient or authorized representative who has indicated his/her understanding and acceptance.   Dental Advisory Given  Plan Discussed with: CRNA and Surgeon  Anesthesia Plan Comments:         Anesthesia Quick Evaluation

## 2012-11-22 NOTE — Transfer of Care (Signed)
Immediate Anesthesia Transfer of Care Note  Patient: Elizabeth Kennedy  Procedure(s) Performed: Procedure(s): EXCISION RIGHT GROIN MASS  (Right)  Patient Location: PACU  Anesthesia Type:General  Level of Consciousness: awake, alert , oriented and patient cooperative  Airway & Oxygen Therapy: Patient Spontanous Breathing and Patient connected to face mask oxygen  Post-op Assessment: Report given to PACU RN, Post -op Vital signs reviewed and stable and Patient moving all extremities  Post vital signs: Reviewed and stable  Complications: No apparent anesthesia complications

## 2012-11-22 NOTE — Anesthesia Postprocedure Evaluation (Signed)
  Anesthesia Post-op Note  Patient: Elizabeth Kennedy  Procedure(s) Performed: Procedure(s) (LRB): EXCISION RIGHT GROIN MASS  (Right)  Patient Location: PACU  Anesthesia Type: General  Level of Consciousness: awake and alert   Airway and Oxygen Therapy: Patient Spontanous Breathing  Post-op Pain: mild  Post-op Assessment: Post-op Vital signs reviewed, Patient's Cardiovascular Status Stable, Respiratory Function Stable, Patent Airway and No signs of Nausea or vomiting  Last Vitals:  Filed Vitals:   11/22/12 0900  BP: 100/55  Pulse: 74  Temp:   Resp: 11    Post-op Vital Signs: stable   Complications: No apparent anesthesia complications

## 2012-11-22 NOTE — Op Note (Signed)
EXCISION RIGHT GROIN MASS   Procedure Note  Elizabeth Kennedy 11/22/2012   Pre-op Diagnosis: right groin mass     Post-op Diagnosis: same  Procedure(s): EXCISION RIGHT GROIN MASS   Surgeon(s): Shelly Rubenstein, MD  Anesthesia: General  Staff:  Circulator: Gerda Diss, RN; Loman Brooklyn, RN Scrub Person: Clarnce Flock, CST Endoscopic Tech: Rhys Martini, EOT  Estimated Blood Loss: Minimal               Specimens: sent to path          [facility]   Date: 11/22/2012  Time: 8:42 AM

## 2012-11-22 NOTE — Preoperative (Signed)
Beta Blockers   Reason not to administer Beta Blockers:Not Applicable 

## 2012-11-23 NOTE — Op Note (Signed)
Elizabeth Kennedy, ROBINSON NO.:  [id number]  MEDICAL RECORD NO.:  [id number]  LOCATION:  [facility]                         FACILITY:  [facility]  PHYSICIAN:  Abigail Miyamoto, M.D. DATE OF BIRTH:  [birth year]  DATE OF PROCEDURE:  11/22/2012 DATE OF DISCHARGE:  11/22/2012                              OPERATIVE REPORT   POSTOPERATIVE DIAGNOSIS:  Right groin mass.  POSTOPERATIVE DIAGNOSIS:  Right groin mass.  PROCEDURE:  Excision of 2 cm right groin mass.  SURGEON:  Abigail Miyamoto, MD  ANESTHESIA:  General and 0.5% Marcaine.  ESTIMATED BLOOD LOSS:  Minimal.  INDICATIONS:  This is a 42 year old female who presents with right groin pain.  She has had a CAT scan of her abdomen and pelvis demonstrating a mass in the right groin which was cystic in appearance approximately 2 to 2.5 cm in size.  Because of her pain, the decision was made to remove this area.  FINDINGS:  The patient was found to have a mass consistent with a large lymph node.  This was excised in its entirety and sent to pathology for evaluation.  PROCEDURE IN DETAIL:  The patient was brought to the operating room and identified as [name].  She was placed supine on the operating table and general anesthesia was induced.  Her right lower quadrant was prepped and draped in usual sterile fashion.  I anesthetized the skin over the palpable mass with Marcaine.  I then made an incision with a scalpel.  I took this down to the subcutaneous tissue with electrocautery.  The patient was found to have a large lymph node, which appeared to have blue coloration in it and was slightly cystic in appearance.  I excised in its entirety with electrocautery as well some adjacent cystic appearing tissue.  This was then sent to pathology fresh for evaluation.  Hemostasis was then achieved with cautery.  I saw no evidence of inguinal or femoral hernia.  I then closed the subcutaneous tissue with interrupted 3-0 Vicryl  sutures after anesthetizing the wound further with Marcaine.  I then closed the skin with running 4-0 Monocryl.  Steri-Strips, gauze, and tape were then applied.  The patient tolerated the procedure well.  All sponge, needle, and instrument counts were correct at the end of the procedure. The patient was then extubated in the operating room and taken in stable condition to recovery room.     Abigail Miyamoto, M.D.     DB/MEDQ  D:  11/22/2012  T:  11/23/2012  Job:  [id number]

## 2012-11-25 ENCOUNTER — Encounter (HOSPITAL_COMMUNITY): Payer: Self-pay | Admitting: Surgery

## 2012-12-03 ENCOUNTER — Telehealth (INDEPENDENT_AMBULATORY_CARE_PROVIDER_SITE_OTHER): Payer: Self-pay

## 2012-12-03 ENCOUNTER — Ambulatory Visit (INDEPENDENT_AMBULATORY_CARE_PROVIDER_SITE_OTHER): Payer: Self-pay | Admitting: Surgery

## 2012-12-03 ENCOUNTER — Encounter (INDEPENDENT_AMBULATORY_CARE_PROVIDER_SITE_OTHER): Payer: Self-pay | Admitting: Surgery

## 2012-12-03 VITALS — BP 110/70 | HR 72 | Temp 98.6°F | Resp 14 | Ht 64.0 in | Wt 138.6 lb

## 2012-12-03 DIAGNOSIS — Z09 Encounter for follow-up examination after completed treatment for conditions other than malignant neoplasm: Secondary | ICD-10-CM

## 2012-12-03 NOTE — Telephone Encounter (Signed)
Pt called stating she has been having increasing groin swelling and discomfort. Pt 10 days post groin mass exc. No fever or redness. Pt given appt this morning with Dr Magnus Ivan to ck wd.

## 2012-12-03 NOTE — Progress Notes (Signed)
Subjective:     Patient ID: Elizabeth Kennedy, female   DOB: 1970/07/21, 42 y.o.   MRN: [id number]  HPI She is here for a postop visit status post excisional biopsy of a right inguinal lymph node. She is having some mild discomfort  Review of Systems     Objective:   Physical Exam On exam, there is a seroma present. I inserted an 18-gauge needle entering 10 cc of fluid. The pathology showed benign hyperplasia with no evidence of malignancy    Assessment:     Patient stable postop     Plan:     I explained the pathology to her. She may resume her normal activity.  I will see her back if the seroma recurs

## 2012-12-09 ENCOUNTER — Ambulatory Visit (INDEPENDENT_AMBULATORY_CARE_PROVIDER_SITE_OTHER): Payer: Self-pay | Admitting: Surgery

## 2012-12-09 ENCOUNTER — Encounter (INDEPENDENT_AMBULATORY_CARE_PROVIDER_SITE_OTHER): Payer: Self-pay | Admitting: Surgery

## 2012-12-09 VITALS — BP 122/80 | HR 68 | Temp 97.4°F | Resp 12 | Ht 64.0 in | Wt 136.8 lb

## 2012-12-09 DIAGNOSIS — Z09 Encounter for follow-up examination after completed treatment for conditions other than malignant neoplasm: Secondary | ICD-10-CM

## 2012-12-09 NOTE — Progress Notes (Signed)
Subjective:     Patient ID: Elizabeth Kennedy, female   DOB: 11/16/1970, 42 y.o.   MRN: [id number]  HPI She reports that the seroma has recurred in her right groin.  Review of Systems     Objective:   Physical Exam On exam, there is a small fluid collection. I inserted a 18-gauge needle and drained 5 cc of seroma fluid    Assessment:     Patient stable postop     Plan:     I will see her as needed unless the seroma recurs and is symptomatic

## 2012-12-13 ENCOUNTER — Telehealth (INDEPENDENT_AMBULATORY_CARE_PROVIDER_SITE_OTHER): Payer: Self-pay | Admitting: General Surgery

## 2012-12-13 ENCOUNTER — Encounter (INDEPENDENT_AMBULATORY_CARE_PROVIDER_SITE_OTHER): Payer: Self-pay | Admitting: Surgery

## 2012-12-13 NOTE — Telephone Encounter (Signed)
Patient called back and we talked about the some swelling that she has in her groin area, and I told her that she will have swelling the groin area after hernia surgery, if she is up moving around more. She asked if she can do heat and ice and I told her yes she can and if she stated to have more swelling bigger and the area gets red and its warm to touch that we have someone on call over the weekend and if she needs to call me next week that I can get her in to see one of Dr Magnus Ivan partner because he is DOW, and she agree to all what I talk to her

## 2012-12-13 NOTE — Telephone Encounter (Signed)
LMOM for patient to call back and ask for [facility]. I want to talk to her about her swelling in her groin area from surgery

## 2012-12-19 ENCOUNTER — Other Ambulatory Visit: Payer: Self-pay

## 2013-04-30 ENCOUNTER — Encounter: Payer: Self-pay | Admitting: Physician Assistant

## 2013-04-30 ENCOUNTER — Encounter: Payer: Self-pay | Admitting: Cardiology

## 2013-06-17 ENCOUNTER — Other Ambulatory Visit: Payer: Self-pay

## 2014-04-02 ENCOUNTER — Other Ambulatory Visit: Payer: Self-pay | Admitting: Neurology

## 2014-04-02 DIAGNOSIS — R519 Headache, unspecified: Secondary | ICD-10-CM

## 2014-04-02 DIAGNOSIS — R51 Headache: Principal | ICD-10-CM

## 2014-04-02 DIAGNOSIS — Z8249 Family history of ischemic heart disease and other diseases of the circulatory system: Secondary | ICD-10-CM

## 2014-04-02 DIAGNOSIS — R29810 Facial weakness: Secondary | ICD-10-CM

## 2014-04-13 ENCOUNTER — Ambulatory Visit
Admission: RE | Admit: 2014-04-13 | Discharge: 2014-04-13 | Disposition: A | Discharge status: Home / Self Care | Payer: 59 | Source: Ambulatory Visit | Attending: Neurology | Admitting: Neurology

## 2014-04-13 DIAGNOSIS — Z8249 Family history of ischemic heart disease and other diseases of the circulatory system: Secondary | ICD-10-CM

## 2014-04-13 DIAGNOSIS — R29810 Facial weakness: Secondary | ICD-10-CM

## 2014-04-13 DIAGNOSIS — R519 Headache, unspecified: Secondary | ICD-10-CM

## 2014-04-13 DIAGNOSIS — R51 Headache: Principal | ICD-10-CM

## 2014-07-14 ENCOUNTER — Encounter (HOSPITAL_COMMUNITY): Payer: Self-pay | Admitting: Emergency Medicine

## 2014-07-14 ENCOUNTER — Emergency Department (INDEPENDENT_AMBULATORY_CARE_PROVIDER_SITE_OTHER)
Admission: EM | Admit: 2014-07-14 | Discharge: 2014-07-14 | Disposition: A | Discharge status: Home / Self Care | Payer: 59 | Source: Home / Self Care | Attending: Family Medicine | Admitting: Family Medicine

## 2014-07-14 DIAGNOSIS — K219 Gastro-esophageal reflux disease without esophagitis: Secondary | ICD-10-CM | POA: Diagnosis not present

## 2014-07-14 DIAGNOSIS — R079 Chest pain, unspecified: Secondary | ICD-10-CM | POA: Diagnosis not present

## 2014-07-14 DIAGNOSIS — IMO0001 Reserved for inherently not codable concepts without codable children: Secondary | ICD-10-CM

## 2014-07-14 MED ORDER — GI COCKTAIL ~~LOC~~
30.0000 mL | Freq: Once | ORAL | Status: AC
  Administered 2014-07-14: 30 mL via ORAL

## 2014-07-14 MED ORDER — GI COCKTAIL ~~LOC~~
ORAL | Status: AC
  Filled 2014-07-14: qty 30

## 2014-07-14 MED ORDER — DICLOFENAC SODIUM 75 MG PO TBEC: 75.0000 mg | DELAYED_RELEASE_TABLET | Freq: Two times a day (BID) | ORAL | Status: DC

## 2014-07-14 MED ORDER — OMEPRAZOLE 40 MG PO CPDR: DELAYED_RELEASE_CAPSULE | ORAL | Status: DC

## 2014-07-14 NOTE — Discharge Instructions (Signed)
The cause of your chest pain is not immediately clear but may be due to a combination of reflux, musculoskeletal pain, pleuritis, and increased nerve sensitivity after coming off of Neurontin. Please start using the Prilosec as prescribed. Please also consider using the Voltaren twice daily for the next several days. You may need to go back on your Neurontin if your symptoms do not improve. Please go to the emergency room if you get worse.

## 2014-07-14 NOTE — ED Provider Notes (Signed)
CSN: [id number]     Arrival date & time 07/14/14  1034 History   First MD Initiated Contact with Patient 07/14/14 1122     Chief Complaint  Patient presents with  . Chest Pain   (Consider location/radiation/quality/duration/timing/severity/associated sxs/prior Treatment) HPI   Chest pain: started 7 days ago. Getting worse. Not moving improves the pain. Worse w/ deep breathing and certain positions. Nausea w/ meals. Epigastric in location and feels like a "pin" in chest. Radiates around lower L chest to back. Denies HA, diaphoresis, nausea, palpitations, SOB. Improves w/ ibuprofen.  H/o pleuritic pain Pt has been tapering off Neurontin 2 weeks ago.  Started Effexor 2 days ago   Past Medical History  Diagnosis Date  . Migraines   . Fibromyalgia   . 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 Anemia    Past Surgical History  Procedure Laterality Date  . Cholecystectomy    . Tubal ligation    . Mass excision Right 11/22/2012    Procedure: EXCISION RIGHT GROIN MASS ;  Surgeon: Harl Bowie, MD;  Location: [facility];  Service: General;  Laterality: Right;   Family History  Problem Relation Age of Onset  . Aneurysm Sister    History  Substance Use Topics  . Smoking status: Never Smoker   . Smokeless tobacco: Never Used  . Alcohol Use: No   OB History    No data available     Review of Systems Per HPI with all other pertinent systems negative.   Allergies  Morphine and related and Tape  Home Medications   Prior to Admission medications   Medication Sig Start Date End Date Taking? Authorizing Provider  SUMAtriptan (IMITREX) 25 MG tablet Take 25 mg by mouth every 2 (two) hours as needed for migraine. May repeat in 2 hours if headache persists or recurs.   Yes Historical Provider, MD  venlafaxine (EFFEXOR) 75 MG tablet Take 75 mg by mouth 2 (two) times daily.   Yes Historical Provider, MD  diclofenac (VOLTAREN) 75 MG EC tablet Take 1 tablet (75 mg total) by  mouth 2 (two) times daily. 07/14/14   Waldemar Dickens, MD  ibuprofen (ADVIL,MOTRIN) 200 MG tablet Take 600 mg by mouth every 6 (six) hours as needed for pain.    Historical Provider, MD  meperidine (DEMEROL) 50 MG tablet Take 50 mg by mouth every 4 (four) hours as needed for pain.    Historical Provider, MD  Misc Natural Products (IMMUNE FORMULA PO) Take 2.5 mLs by mouth 2 (two) times daily. [place]    Historical Provider, MD  omeprazole (PRILOSEC) 40 MG capsule Take 1 capsule by mouth daily for 14 days then 1 capsule daily as needed for reflux. 07/14/14   Waldemar Dickens, MD  promethazine (PHENERGAN) 25 MG tablet Take 1 tablet (25 mg total) by mouth every 6 (six) hours as needed. For nausea 11/09/12   Domenic Moras, PA-C  topiramate (TOPAMAX) 100 MG tablet Take 100 mg by mouth at bedtime.    Historical Provider, MD   BP 119/48 mmHg  Pulse 72  Temp(Src) 98.7 F (37.1 C) (Oral)  Resp 16  SpO2 100%  LMP 06/28/2014 Physical Exam Physical Exam  Constitutional: oriented to person, place, and time. appears well-developed and well-nourished. No distress.  HENT:  Head: Normocephalic and atraumatic.  Eyes: EOMI. PERRL.  Neck: Normal range of motion.  Cardiovascular: RRR, no m/r/g, 2+ distal pulses,  Pulmonary/Chest: Effort normal  and breath sounds normal. No respiratory distress.  Abdominal: Soft. Bowel sounds are normal. NonTTP, no distension.  Musculoskeletal: Chest pain reproducible on palpation of lower costochondral sternal border and radiation around to the left side along the 10th to 12th ribs. No bony upper modalities. No overlying rash..  Neurological: alert and oriented to person, place, and time.  Skin: Skin is warm. No rash noted. non diaphoretic.  Psychiatric: normal mood and affect. behavior is normal. Judgment and thought content normal.   ED Course  Procedures (including critical care time) Labs Review Labs Reviewed - No data to display  Imaging Review No  results found.   MDM   1. Chest pain, unspecified chest pain type   2. Reflux    GI cocktail with resolution of nausea but did not impact chest pain. Chest pain is likely multifactorial with an unclear etiology. Unlikely related to her heart given EKG in recent history. Suspect musculoskeletal, pleuritic, and hypersensitivity of nervous after weaning off of Neurontin. Patient with history of fibromyalgia. Patient to use Prilosec for 14 days for  esophageal reflux into start Voltaren for pleuritis in possible costochondritis or other musculoskeletal pain. No evidence of shingles. Patient follow-up in the ED if she gets worse.   Waldemar Dickens, MD 07/14/14 [phone]

## 2014-07-14 NOTE — ED Notes (Signed)
Patient c/o chest x 5 days. Patient [place] the beach and felt sick the day after and is unsure if maybe she pulled a muscle. Patient also states a hx of pleurisy. Patient reports she tried lying down and taking ibuprofen for 2 days with no relief. NAD.

## 2014-12-18 ENCOUNTER — Emergency Department (HOSPITAL_COMMUNITY)
Admission: EM | Admit: 2014-12-18 | Discharge: 2014-12-18 | Disposition: A | Discharge status: Home / Self Care | Payer: Medicare Other | Attending: Emergency Medicine | Admitting: Emergency Medicine

## 2014-12-18 ENCOUNTER — Emergency Department (HOSPITAL_COMMUNITY): Payer: Medicare Other

## 2014-12-18 ENCOUNTER — Encounter (HOSPITAL_COMMUNITY): Payer: Self-pay | Admitting: Emergency Medicine

## 2014-12-18 DIAGNOSIS — H53459 Other localized visual field defect, unspecified eye: Secondary | ICD-10-CM | POA: Diagnosis not present

## 2014-12-18 DIAGNOSIS — R200 Anesthesia of skin: Secondary | ICD-10-CM | POA: Diagnosis present

## 2014-12-18 DIAGNOSIS — R197 Diarrhea, unspecified: Secondary | ICD-10-CM | POA: Insufficient documentation

## 2014-12-18 DIAGNOSIS — R109 Unspecified abdominal pain: Secondary | ICD-10-CM | POA: Insufficient documentation

## 2014-12-18 DIAGNOSIS — H5340 Unspecified visual field defects: Secondary | ICD-10-CM

## 2014-12-18 DIAGNOSIS — Z862 Personal history of diseases of the blood and blood-forming organs and certain disorders involving the immune mechanism: Secondary | ICD-10-CM | POA: Diagnosis not present

## 2014-12-18 DIAGNOSIS — G43109 Migraine with aura, not intractable, without status migrainosus: Secondary | ICD-10-CM | POA: Insufficient documentation

## 2014-12-18 DIAGNOSIS — Z79899 Other long term (current) drug therapy: Secondary | ICD-10-CM | POA: Diagnosis not present

## 2014-12-18 LAB — URINALYSIS, ROUTINE W REFLEX MICROSCOPIC
Bilirubin Urine: NEGATIVE
GLUCOSE, UA: NEGATIVE mg/dL
Hgb urine dipstick: NEGATIVE
KETONES UR: NEGATIVE mg/dL
Leukocytes, UA: NEGATIVE
NITRITE: NEGATIVE
PH: 7 (ref 5.0–8.0)
Protein, ur: NEGATIVE mg/dL
SPECIFIC GRAVITY, URINE: 1.022 (ref 1.005–1.030)
Urobilinogen, UA: 0.2 mg/dL (ref 0.0–1.0)

## 2014-12-18 LAB — CBC
HEMATOCRIT: 34.1 % — AB (ref 36.0–46.0)
Hemoglobin: 10.5 g/dL — ABNORMAL LOW (ref 12.0–15.0)
MCH: 25.4 pg — AB (ref 26.0–34.0)
MCHC: 30.8 g/dL (ref 30.0–36.0)
MCV: 82.4 fL (ref 78.0–100.0)
Platelets: 281 10*3/uL (ref 150–400)
RBC: 4.14 MIL/uL (ref 3.87–5.11)
RDW: 16.3 % — ABNORMAL HIGH (ref 11.5–15.5)
WBC: 4.9 10*3/uL (ref 4.0–10.5)

## 2014-12-18 LAB — COMPREHENSIVE METABOLIC PANEL
ALK PHOS: 44 U/L (ref 38–126)
ALT: 15 U/L (ref 14–54)
AST: 18 U/L (ref 15–41)
Albumin: 3.9 g/dL (ref 3.5–5.0)
Anion gap: 4 — ABNORMAL LOW (ref 5–15)
BUN: 15 mg/dL (ref 6–20)
CHLORIDE: 111 mmol/L (ref 101–111)
CO2: 22 mmol/L (ref 22–32)
Calcium: 8.7 mg/dL — ABNORMAL LOW (ref 8.9–10.3)
Creatinine, Ser: 0.78 mg/dL (ref 0.44–1.00)
GFR calc non Af Amer: >60 mL/min (ref >60)
Glucose, Bld: 93 mg/dL (ref 65–99)
POTASSIUM: 4 mmol/L (ref 3.5–5.1)
SODIUM: 137 mmol/L (ref 135–145)
Total Bilirubin: 0.6 mg/dL (ref 0.3–1.2)
Total Protein: 7 g/dL (ref 6.5–8.1)

## 2014-12-18 LAB — LIPASE, BLOOD: LIPASE: 28 U/L (ref 11–51)

## 2014-12-18 MED ORDER — METOCLOPRAMIDE HCL 5 MG/ML IJ SOLN
10.0000 mg | Freq: Once | INTRAMUSCULAR | Status: AC
Start: 2014-12-18 — End: 2014-12-18
  Administered 2014-12-18: 10 mg via INTRAVENOUS
  Filled 2014-12-18: qty 2

## 2014-12-18 MED ORDER — DIPHENHYDRAMINE HCL 50 MG/ML IJ SOLN
25.0000 mg | Freq: Once | INTRAMUSCULAR | Status: AC
  Administered 2014-12-18: 25 mg via INTRAVENOUS
  Filled 2014-12-18: qty 1

## 2014-12-18 MED ORDER — SODIUM CHLORIDE 0.9 % IV BOLUS (SEPSIS)
1000.0000 mL | Freq: Once | INTRAVENOUS | Status: AC
  Administered 2014-12-18: 1000 mL via INTRAVENOUS

## 2014-12-18 NOTE — ED Notes (Signed)
MRI called and stated would be around an hour before they can get pt for her scan.

## 2014-12-18 NOTE — ED Notes (Signed)
In addition to previous note, patient adds unilateral left side facial numbness onset 1 hour ago along with intermittent vision changes described as a "strobing L- shaped" loss of vision. Pt states she gets migraines that include tunneled vision loss and numbness, but that this is different that the usual vision loss and the numbness is slightly different. Darl Householder, MD, made aware of patient's symptoms, orders CT of head.

## 2014-12-18 NOTE — Discharge Instructions (Signed)
You likely have complex migraine.   Continue your current meds.   See your neurologist.   Return to ER if you have worse headaches, vomiting, fevers, worse blurry vision, weakness.

## 2014-12-18 NOTE — ED Provider Notes (Signed)
CSN: [id number]     Arrival date & time 12/18/14  1120 History   First MD Initiated Contact with Patient 12/18/14 1234     Chief Complaint  Patient presents with  . Numbness  . Visual Field Change  . Abdominal Pain     (Consider location/radiation/quality/duration/timing/severity/associated sxs/prior Treatment) The history is provided by the patient.  Elizabeth Kennedy is a 44 y.o. female hx of migraines, fibromyalgia here with abdominal pain, blurry vision, headaches. States that she had some upset stomach and diarrhea for several days. Also nausea as well. Had worsening headache started this morning. States that woke up at 8am and around 10 am, had loss of vision of the left side of her visual field. States that she sees a C shaped vision loss on left side. States that her usual migraines are tunnel vision and right facial numbness. Denies weakness. Denies fevers. She follows up with Dr. Melton Alar from neurology. Had MRI less than a year ago that had incidental findings but no obvious stroke or multiple sclerosis.    Past Medical History  Diagnosis Date  . Migraines   . Fibromyalgia   . 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 Anemia    Past Surgical History  Procedure Laterality Date  . Cholecystectomy    . Tubal ligation    . Mass excision Right 11/22/2012    Procedure: EXCISION RIGHT GROIN MASS ;  Surgeon: Harl Bowie, MD;  Location: [facility];  Service: General;  Laterality: Right;   Family History  Problem Relation Age of Onset  . Aneurysm Sister    Social History  Substance Use Topics  . Smoking status: Never Smoker   . Smokeless tobacco: Never Used  . Alcohol Use: No   OB History    No data available     Review of Systems  Eyes: Positive for visual disturbance.  Gastrointestinal: Positive for abdominal pain.  Neurological: Positive for headaches.  All other systems reviewed and are negative.     Allergies  Morphine and related and  Tape  Home Medications   Prior to Admission medications   Medication Sig Start Date End Date Taking? Authorizing Provider  ibuprofen (ADVIL,MOTRIN) 200 MG tablet Take 600 mg by mouth every 6 (six) hours as needed for pain.   Yes Historical Provider, MD  PARoxetine (PAXIL) 20 MG tablet Take 20 mg by mouth daily.   Yes Historical Provider, MD  SUMAtriptan (IMITREX) 25 MG tablet Take 25 mg by mouth every 2 (two) hours as needed for migraine. May repeat in 2 hours if headache persists or recurs.   Yes Historical Provider, MD  topiramate (TOPAMAX) 100 MG tablet Take 200 mg by mouth daily.    Yes Historical Provider, MD  omeprazole (PRILOSEC) 40 MG capsule Take 1 capsule by mouth daily for 14 days then 1 capsule daily as needed for reflux. Patient not taking: Reported on 12/18/2014 07/14/14   Waldemar Dickens, MD   BP 121/69 mmHg  Pulse 69  Temp(Src) 98 F (36.7 C) (Oral)  Resp 18  SpO2 100%  LMP 12/16/2014 Physical Exam  Constitutional: She is oriented to person, place, and time.  Uncomfortable   HENT:  Head: Normocephalic.  Right Ear: External ear normal.  Eyes: Conjunctivae and EOM are normal. Pupils are equal, round, and reactive to light.  Neck: Normal range of motion. Neck supple.  Cardiovascular: Normal rate, regular rhythm and normal heart sounds.   Pulmonary/Chest: Effort normal  and breath sounds normal. No respiratory distress. She has no wheezes. She has no rales.  Abdominal: Soft. Bowel sounds are normal. She exhibits no distension. There is no tenderness. There is no rebound.  Musculoskeletal: Normal range of motion.  Neurological: She is alert and oriented to person, place, and time.  L upper field cut bilaterally. CN otherwise intact. Nl sensation throughout. Nl strength throughout. Nl finger to nose   Skin: Skin is warm and dry.  Psychiatric: She has a normal mood and affect. Her behavior is normal. Judgment and thought content normal.  Nursing note and vitals  reviewed.   ED Course  Procedures (including critical care time) Labs Review Labs Reviewed  COMPREHENSIVE METABOLIC PANEL - Abnormal; Notable for the following:    Calcium 8.7 (*)    Anion gap 4 (*)    All other components within normal limits  CBC - Abnormal; Notable for the following:    Hemoglobin 10.5 (*)    HCT 34.1 (*)    MCH 25.4 (*)    RDW 16.3 (*)    All other components within normal limits  URINALYSIS, ROUTINE W REFLEX MICROSCOPIC (NOT AT [facility]) - Abnormal; Notable for the following:    APPearance CLOUDY (*)    All other components within normal limits  LIPASE, BLOOD  URINE MICROSCOPIC-ADD ON    Imaging Review Ct Head Wo Contrast  12/18/2014  CLINICAL DATA:  Exhaustion. Blurry vision. Numbness to left side of face EXAM: CT HEAD WITHOUT CONTRAST TECHNIQUE: Contiguous axial images were obtained from the base of the skull through the vertex without intravenous contrast. COMPARISON:  04/14/2014 FINDINGS: No acute cortical infarct, hemorrhage, or mass lesion ispresent. Ventricles are of normal size. No significant extra-axial fluid collection is present. The paranasal sinuses andmastoid air cells are clear. The osseous skull is intact. IMPRESSION: Normal brain. Electronically Signed   By: Kerby Moors M.D.   On: 12/18/2014 13:03   Mr Brain Wo Contrast  12/18/2014  CLINICAL DATA:  44 year old female with left facial numbness x3 hours with visual changes. Initial encounter. EXAM: MRI HEAD WITHOUT CONTRAST TECHNIQUE: Multiplanar, multiecho pulse sequences of the brain and surrounding structures were obtained without intravenous contrast. COMPARISON:  Head CT without contrast 1259 hours today. Brain MRI [facility] 04/13/2014. FINDINGS: Major intracranial vascular flow voids are stable and within normal limits. No restricted diffusion to suggest acute infarction. No midline shift, mass effect, evidence of mass lesion, ventriculomegaly, extra-axial collection or acute  intracranial hemorrhage. Cervicomedullary junction and pituitary are within normal limits. Stable and normal gray and white matter signal throughout the brain. Visualized internal auditory structures appear stable and within normal limits. Mastoids remain clear. Stable trace paranasal sinus mucosal thickening. Negative orbit and scalp soft tissues. Negative visualized cervical spine. Bone marrow signal stable and within normal limits. IMPRESSION: Stable and negative noncontrast MRI appearance of the brain. Electronically Signed   By: Genevie Ann M.D.   On: 12/18/2014 14:50   I have personally reviewed and evaluated these images and lab results as part of my medical decision-making.   EKG Interpretation None      MDM   Final diagnoses:  None    Elizabeth Kennedy is a 44 y.o. female here with L upper field cut, headaches. Likely complex migraine vs stroke vs TIA. Consulted Dr. Leonel Ramsay when I saw the patient, who states that she doesn't qualify for TPA and get MRI brain and if it showed no stroke, will not need further workup.  3:12 PM MRI showed no stroke. Headache improved with migraine cocktail. Will dc home. Has neuro f/u.    Wandra Arthurs, MD 12/18/14 [phone]

## 2014-12-18 NOTE — ED Notes (Signed)
MD at bedside. 

## 2014-12-18 NOTE — ED Notes (Signed)
Per pt, states started feeling fatigued last Friday-upset stomach followed-states nose bleeds yesterday-multiple complaints, headache

## 2014-12-18 NOTE — ED Notes (Signed)
Patient transported to CT 

## 2015-05-04 ENCOUNTER — Emergency Department (HOSPITAL_COMMUNITY): Payer: Medicare Other

## 2015-05-04 ENCOUNTER — Emergency Department (HOSPITAL_COMMUNITY)
Admission: EM | Admit: 2015-05-04 | Discharge: 2015-05-04 | Disposition: A | Discharge status: Home / Self Care | Payer: Medicare Other | Attending: Emergency Medicine | Admitting: Emergency Medicine

## 2015-05-04 ENCOUNTER — Encounter (HOSPITAL_COMMUNITY): Payer: Self-pay | Admitting: Emergency Medicine

## 2015-05-04 DIAGNOSIS — J069 Acute upper respiratory infection, unspecified: Secondary | ICD-10-CM | POA: Insufficient documentation

## 2015-05-04 DIAGNOSIS — Z79899 Other long term (current) drug therapy: Secondary | ICD-10-CM | POA: Insufficient documentation

## 2015-05-04 DIAGNOSIS — Z862 Personal history of diseases of the blood and blood-forming organs and certain disorders involving the immune mechanism: Secondary | ICD-10-CM | POA: Insufficient documentation

## 2015-05-04 DIAGNOSIS — R05 Cough: Secondary | ICD-10-CM | POA: Diagnosis present

## 2015-05-04 DIAGNOSIS — M797 Fibromyalgia: Secondary | ICD-10-CM | POA: Diagnosis not present

## 2015-05-04 DIAGNOSIS — R058 Other specified cough: Secondary | ICD-10-CM

## 2015-05-04 DIAGNOSIS — R197 Diarrhea, unspecified: Secondary | ICD-10-CM | POA: Insufficient documentation

## 2015-05-04 DIAGNOSIS — G43909 Migraine, unspecified, not intractable, without status migrainosus: Secondary | ICD-10-CM | POA: Diagnosis not present

## 2015-05-04 LAB — RAPID STREP SCREEN (MED CTR MEBANE ONLY): Streptococcus, Group A Screen (Direct): NEGATIVE

## 2015-05-04 MED ORDER — ACETAMINOPHEN-CODEINE 120-12 MG/5ML PO SUSP: 7.0000 mL | Freq: Three times a day (TID) | ORAL | Status: DC | PRN

## 2015-05-04 NOTE — Discharge Instructions (Signed)
1. Medications: mucinex, cough syrup as needed - This can make you very drowsy - please do not drink or drive on this medication, usual home medications 2. Treatment: rest, drink plenty of fluids, take ibuprofen for fever control (your cough syrup has tylenol in it. Do not use additional tylenol when using syrup).  3. Follow Up: Please follow up with your primary doctor in 3 days for discussion of your diagnoses and further evaluation after today's visit; Return to the ER for high fevers, difficulty breathing or other concerning symptoms

## 2015-05-04 NOTE — ED Notes (Signed)
Patients states that Friday night she began to have body aches, productive cough (green mucus), sore throat, and clear nasal drainage, fever, nausea, diarrhea. Denies vomiting.

## 2015-05-04 NOTE — ED Provider Notes (Signed)
CSN: [id number]     Arrival date & time 05/04/15  1436 History   First MD Initiated Contact with Patient 05/04/15 2120     Chief Complaint  Patient presents with  . Generalized Body Aches  . Cough     (Consider location/radiation/quality/duration/timing/severity/associated sxs/prior Treatment) Patient is a 45 y.o. female presenting with cough. The history is provided by the patient and medical records. No language interpreter was used.  Cough Associated symptoms: chills, fever, myalgias and sore throat   Associated symptoms: no headaches, no rash, no shortness of breath and no wheezing    Elizabeth Kennedy is a 45 y.o. female  who presents to the Emergency Department complaining of worsening moderate productive cough and nasal congestion since Friday. Associated symptoms include 3 episodes of loose stools today, sore throat, myalgias, and subjective fever. Mucinex was taken with some relief. No other medications taken prior to arrival for symptoms. Denies shortness of breath, abdominal pain, chest pain, vomiting.   Past Medical History  Diagnosis Date  . Migraines   . Fibromyalgia   . 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 Anemia    Past Surgical History  Procedure Laterality Date  . Cholecystectomy    . Tubal ligation    . Mass excision Right 11/22/2012    Procedure: EXCISION RIGHT GROIN MASS ;  Surgeon: Harl Bowie, MD;  Location: [facility];  Service: General;  Laterality: Right;   Family History  Problem Relation Age of Onset  . Aneurysm Sister    Social History  Substance Use Topics  . Smoking status: Never Smoker   . Smokeless tobacco: Never Used  . Alcohol Use: No   OB History    No data available     Review of Systems  Constitutional: Positive for fever and chills.  HENT: Positive for congestion and sore throat.   Eyes: Negative for visual disturbance.  Respiratory: Positive for cough. Negative for shortness of breath and wheezing.     Cardiovascular: Negative.   Gastrointestinal: Positive for diarrhea. Negative for vomiting, abdominal pain and blood in stool.  Genitourinary: Negative for dysuria.  Musculoskeletal: Positive for myalgias. Negative for neck pain and neck stiffness.  Skin: Negative for rash.  Neurological: Negative for dizziness and headaches.      Allergies  Morphine and related and Tape  Home Medications   Prior to Admission medications   Medication Sig Start Date End Date Taking? Authorizing Provider  ibuprofen (ADVIL,MOTRIN) 200 MG tablet Take 600 mg by mouth every 6 (six) hours as needed for pain.   Yes Historical Provider, MD  meperidine (DEMEROL) 50 MG tablet Take 50 mg by mouth daily as needed. Headache. 04/16/13  Yes Historical Provider, MD  Methocarbamol (ROBAXIN PO) Take 1 tablet by mouth at bedtime and may repeat dose one time if needed.   Yes Historical Provider, MD  SUMAtriptan (IMITREX) 25 MG tablet Take 25 mg by mouth every 2 (two) hours as needed for migraine. May repeat in 2 hours if headache persists or recurs.   Yes Historical Provider, MD  topiramate (TOPAMAX) 100 MG tablet Take 200 mg by mouth daily.    Yes Historical Provider, MD  acetaminophen-codeine 120-12 MG/5ML suspension Take 7 mLs by mouth every 8 (eight) hours as needed (Cough). 05/04/15   Jaime Pilcher Ward, PA-C  omeprazole (PRILOSEC) 40 MG capsule Take 1 capsule by mouth daily for 14 days then 1 capsule daily as needed for reflux. Patient not taking:  Reported on 12/18/2014 07/14/14   Waldemar Dickens, MD   BP 104/60 mmHg  Pulse 103  Temp(Src) 99.5 F (37.5 C) (Oral)  Resp 18  Ht 5\' 4"  (1.626 m)  Wt 68.04 kg  BMI 25.73 kg/m2  SpO2 95%  LMP 04/20/2015 Physical Exam  Constitutional: She is oriented to person, place, and time. She appears well-developed and well-nourished.  Alert and in no acute distress  HENT:  Head: Normocephalic and atraumatic.  Mouth/Throat: Oropharynx is clear and moist. No oropharyngeal exudate.   + nasal congestion.   Neck: Normal range of motion. Neck supple.  No meningeal signs.  Cardiovascular: Normal rate, regular rhythm, normal heart sounds and intact distal pulses.  Exam reveals no gallop and no friction rub.   No murmur heard. Pulmonary/Chest: Effort normal and breath sounds normal. No respiratory distress. She has no wheezes. She has no rales. She exhibits tenderness.    Abdominal: Soft. Bowel sounds are normal. She exhibits no distension and no mass. There is no tenderness. There is no rebound and no guarding.  Musculoskeletal: Normal range of motion.  Lymphadenopathy:    She has no cervical adenopathy.  Neurological: She is alert and oriented to person, place, and time.  Skin: Skin is warm and dry. No rash noted.  Nursing note and vitals reviewed.   ED Course  Procedures (including critical care time) Labs Review Labs Reviewed  RAPID STREP SCREEN (NOT AT [facility])  CULTURE, GROUP A STREP [facility])    Imaging Review Dg Chest 2 View  05/04/2015  CLINICAL DATA:  Productive cough, bilateral lower chest pain, and shortness of breath for 5 days. EXAM: CHEST  2 VIEW COMPARISON:  01/23/2012 FINDINGS: The cardiomediastinal silhouette is within normal limits. The lungs are well inflated and clear. There is no evidence of pleural effusion or pneumothorax. No acute osseous abnormality is identified. Right upper quadrant abdominal surgical clips are noted. IMPRESSION: No active cardiopulmonary disease. Electronically Signed   By: Logan Bores M.D.   On: 05/04/2015 22:39   I have personally reviewed and evaluated these images and lab results as part of my medical decision-making.   EKG Interpretation None      MDM   Final diagnoses:  Productive cough  URI (upper respiratory infection)   Elizabeth Kennedy presents with multiple symptoms including cough/congestion/sore throat. Tmax 99.7 in ED. On exam, she is non-toxic appearing, afebrile with a clear lung exam. Chest  x-ray unremarkable and rapid strep negative. Likely viral URI. Patient is agreeable to symptomatic treatment with close follow up with PCP as needed but spoke at length about emergent, changing, or worsening of symptoms that should prompt return to ER. Patient voices understanding and is agreeable to plan.    [facility] Ward, PA-C 05/04/15 [id number]  Virgel Manifold, MD 05/11/15 [phone]

## 2015-05-07 LAB — CULTURE, GROUP A STREP (THRC)

## 2015-05-24 ENCOUNTER — Encounter (HOSPITAL_COMMUNITY): Payer: Self-pay | Admitting: *Deleted

## 2015-06-21 ENCOUNTER — Ambulatory Visit (HOSPITAL_COMMUNITY)
Admission: RE | Admit: 2015-06-21 | Discharge: 2015-06-21 | Disposition: A | Discharge status: Home / Self Care | Payer: Medicare Other | Source: Ambulatory Visit | Attending: Obstetrics and Gynecology | Admitting: Obstetrics and Gynecology

## 2015-06-21 ENCOUNTER — Ambulatory Visit (HOSPITAL_COMMUNITY): Payer: Medicare Other | Admitting: Anesthesiology

## 2015-06-21 ENCOUNTER — Encounter (HOSPITAL_COMMUNITY): Payer: Self-pay | Admitting: Emergency Medicine

## 2015-06-21 ENCOUNTER — Encounter (HOSPITAL_COMMUNITY)
Admission: RE | Disposition: A | Discharge status: Home / Self Care | Payer: Self-pay | Source: Ambulatory Visit | Attending: Obstetrics and Gynecology

## 2015-06-21 DIAGNOSIS — Z91048 Other nonmedicinal substance allergy status: Secondary | ICD-10-CM | POA: Diagnosis not present

## 2015-06-21 DIAGNOSIS — K589 Irritable bowel syndrome without diarrhea: Secondary | ICD-10-CM | POA: Diagnosis not present

## 2015-06-21 DIAGNOSIS — Z9851 Tubal ligation status: Secondary | ICD-10-CM | POA: Diagnosis not present

## 2015-06-21 DIAGNOSIS — Z79899 Other long term (current) drug therapy: Secondary | ICD-10-CM | POA: Diagnosis not present

## 2015-06-21 DIAGNOSIS — J302 Other seasonal allergic rhinitis: Secondary | ICD-10-CM | POA: Diagnosis not present

## 2015-06-21 DIAGNOSIS — Z9049 Acquired absence of other specified parts of digestive tract: Secondary | ICD-10-CM | POA: Diagnosis not present

## 2015-06-21 DIAGNOSIS — M797 Fibromyalgia: Secondary | ICD-10-CM | POA: Diagnosis not present

## 2015-06-21 DIAGNOSIS — N920 Excessive and frequent menstruation with regular cycle: Secondary | ICD-10-CM | POA: Diagnosis present

## 2015-06-21 DIAGNOSIS — Z885 Allergy status to narcotic agent status: Secondary | ICD-10-CM | POA: Insufficient documentation

## 2015-06-21 DIAGNOSIS — N840 Polyp of corpus uteri: Secondary | ICD-10-CM | POA: Diagnosis not present

## 2015-06-21 DIAGNOSIS — K219 Gastro-esophageal reflux disease without esophagitis: Secondary | ICD-10-CM | POA: Insufficient documentation

## 2015-06-21 HISTORY — DX: Irritable bowel syndrome without diarrhea: K58.9

## 2015-06-21 HISTORY — DX: Other seasonal allergic rhinitis: J30.2

## 2015-06-21 HISTORY — PX: DILATATION & CURETTAGE/HYSTEROSCOPY WITH MYOSURE: SHX6511

## 2015-06-21 LAB — PREGNANCY, URINE: Preg Test, Ur: NEGATIVE

## 2015-06-21 LAB — CBC
HEMATOCRIT: 36.1 % (ref 36.0–46.0)
HEMOGLOBIN: 11.7 g/dL — AB (ref 12.0–15.0)
MCH: 27.3 pg (ref 26.0–34.0)
MCHC: 32.4 g/dL (ref 30.0–36.0)
MCV: 84.1 fL (ref 78.0–100.0)
Platelets: 262 10*3/uL (ref 150–400)
RBC: 4.29 MIL/uL (ref 3.87–5.11)
RDW: 16.1 % — ABNORMAL HIGH (ref 11.5–15.5)
WBC: 6 10*3/uL (ref 4.0–10.5)

## 2015-06-21 SURGERY — DILATATION & CURETTAGE/HYSTEROSCOPY WITH MYOSURE
Anesthesia: General

## 2015-06-21 MED ORDER — ONDANSETRON HCL 4 MG/2ML IJ SOLN
INTRAMUSCULAR | Status: AC
  Filled 2015-06-21: qty 2

## 2015-06-21 MED ORDER — LIDOCAINE HCL (CARDIAC) 20 MG/ML IV SOLN
INTRAVENOUS | Status: DC | PRN
  Administered 2015-06-21: 100 mg via INTRAVENOUS

## 2015-06-21 MED ORDER — MIDAZOLAM HCL 2 MG/2ML IJ SOLN
INTRAMUSCULAR | Status: AC
  Filled 2015-06-21: qty 2

## 2015-06-21 MED ORDER — LIDOCAINE HCL 1 % IJ SOLN
INTRAMUSCULAR | Status: DC | PRN
  Administered 2015-06-21: 10 mL

## 2015-06-21 MED ORDER — FENTANYL CITRATE (PF) 100 MCG/2ML IJ SOLN
INTRAMUSCULAR | Status: AC
  Filled 2015-06-21: qty 2

## 2015-06-21 MED ORDER — FENTANYL CITRATE (PF) 100 MCG/2ML IJ SOLN
INTRAMUSCULAR | Status: AC
  Filled 2015-06-21: qty 4

## 2015-06-21 MED ORDER — METOCLOPRAMIDE HCL 5 MG/ML IJ SOLN: 10.0000 mg | Freq: Once | INTRAMUSCULAR | Status: DC

## 2015-06-21 MED ORDER — DEXAMETHASONE SODIUM PHOSPHATE 4 MG/ML IJ SOLN
INTRAMUSCULAR | Status: AC
  Filled 2015-06-21: qty 1

## 2015-06-21 MED ORDER — MIDAZOLAM HCL 2 MG/2ML IJ SOLN
INTRAMUSCULAR | Status: DC | PRN
  Administered 2015-06-21: 1 mg via INTRAVENOUS

## 2015-06-21 MED ORDER — GLYCOPYRROLATE 0.2 MG/ML IJ SOLN
INTRAMUSCULAR | Status: DC | PRN
  Administered 2015-06-21: 0.1 mg via INTRAVENOUS

## 2015-06-21 MED ORDER — DEXAMETHASONE SODIUM PHOSPHATE 4 MG/ML IJ SOLN
INTRAMUSCULAR | Status: DC | PRN
  Administered 2015-06-21: 4 mg via INTRAVENOUS

## 2015-06-21 MED ORDER — LIDOCAINE HCL 1 % IJ SOLN
INTRAMUSCULAR | Status: AC
  Filled 2015-06-21: qty 20

## 2015-06-21 MED ORDER — SCOPOLAMINE 1 MG/3DAYS TD PT72
1.0000 | MEDICATED_PATCH | TRANSDERMAL | Status: DC
  Administered 2015-06-21: 1.5 mg via TRANSDERMAL

## 2015-06-21 MED ORDER — FENTANYL CITRATE (PF) 250 MCG/5ML IJ SOLN
INTRAMUSCULAR | Status: DC | PRN
  Administered 2015-06-21 (×2): 50 ug via INTRAVENOUS
  Administered 2015-06-21: 100 ug via INTRAVENOUS

## 2015-06-21 MED ORDER — MEPERIDINE HCL 25 MG/ML IJ SOLN
6.2500 mg | INTRAMUSCULAR | Status: DC | PRN
  Administered 2015-06-21: 12.5 mg via INTRAVENOUS

## 2015-06-21 MED ORDER — MEPERIDINE HCL 25 MG/ML IJ SOLN
INTRAMUSCULAR | Status: AC
  Filled 2015-06-21: qty 1

## 2015-06-21 MED ORDER — SCOPOLAMINE 1 MG/3DAYS TD PT72
MEDICATED_PATCH | TRANSDERMAL | Status: AC
  Administered 2015-06-21: 1.5 mg via TRANSDERMAL
  Filled 2015-06-21: qty 1

## 2015-06-21 MED ORDER — KETOROLAC TROMETHAMINE 30 MG/ML IJ SOLN
INTRAMUSCULAR | Status: DC | PRN
  Administered 2015-06-21: 30 mg via INTRAVENOUS

## 2015-06-21 MED ORDER — OXYCODONE-ACETAMINOPHEN 5-325 MG PO TABS: 1.0000 | ORAL_TABLET | Freq: Four times a day (QID) | ORAL | Status: DC | PRN

## 2015-06-21 MED ORDER — PROPOFOL 10 MG/ML IV BOLUS
INTRAVENOUS | Status: DC | PRN
  Administered 2015-06-21: 150 mg via INTRAVENOUS

## 2015-06-21 MED ORDER — PROPOFOL 10 MG/ML IV BOLUS
INTRAVENOUS | Status: AC
  Filled 2015-06-21: qty 20

## 2015-06-21 MED ORDER — FENTANYL CITRATE (PF) 100 MCG/2ML IJ SOLN
25.0000 ug | INTRAMUSCULAR | Status: DC | PRN
  Administered 2015-06-21: 50 ug via INTRAVENOUS

## 2015-06-21 MED ORDER — IBUPROFEN 200 MG PO TABS
200.0000 mg | ORAL_TABLET | Freq: Four times a day (QID) | ORAL | Status: DC | PRN
  Filled 2015-06-21: qty 2

## 2015-06-21 MED ORDER — LIDOCAINE HCL (CARDIAC) 20 MG/ML IV SOLN
INTRAVENOUS | Status: AC
  Filled 2015-06-21: qty 5

## 2015-06-21 MED ORDER — LACTATED RINGERS IV SOLN
INTRAVENOUS | Status: DC
  Administered 2015-06-21 (×2): via INTRAVENOUS

## 2015-06-21 MED ORDER — IBUPROFEN 100 MG/5ML PO SUSP
200.0000 mg | Freq: Four times a day (QID) | ORAL | Status: DC | PRN
  Filled 2015-06-21: qty 20

## 2015-06-21 MED ORDER — KETOROLAC TROMETHAMINE 30 MG/ML IJ SOLN: 30.0000 mg | Freq: Once | INTRAMUSCULAR | Status: DC

## 2015-06-21 MED ORDER — ONDANSETRON HCL 4 MG/2ML IJ SOLN
INTRAMUSCULAR | Status: DC | PRN
Start: 2015-06-21 — End: 2015-06-21
  Administered 2015-06-21: 4 mg via INTRAVENOUS

## 2015-06-21 MED ORDER — ONDANSETRON HCL 4 MG/2ML IJ SOLN: 4.0000 mg | Freq: Once | INTRAMUSCULAR | Status: DC | PRN

## 2015-06-21 MED ORDER — HYDROCODONE-ACETAMINOPHEN 7.5-325 MG PO TABS: 1.0000 | ORAL_TABLET | Freq: Once | ORAL | Status: DC | PRN

## 2015-06-21 MED ORDER — METOCLOPRAMIDE HCL 5 MG/ML IJ SOLN
INTRAMUSCULAR | Status: AC
  Administered 2015-06-21: 10 mg
  Filled 2015-06-21: qty 2

## 2015-06-21 SURGICAL SUPPLY — 21 items
CANISTER SUCT 3000ML (MISCELLANEOUS) ×3 IMPLANT
CATH ROBINSON RED A/P 16FR (CATHETERS) ×3 IMPLANT
CLOTH BEACON ORANGE TIMEOUT ST (SAFETY) ×3 IMPLANT
CONTAINER PREFILL 10% NBF 60ML (FORM) ×6 IMPLANT
DEVICE MYOSURE LITE (MISCELLANEOUS) ×3 IMPLANT
ELECT REM PT RETURN 9FT ADLT (ELECTROSURGICAL)
ELECTRODE REM PT RTRN 9FT ADLT (ELECTROSURGICAL) IMPLANT
GLOVE BIOGEL PI IND STRL 6.5 (GLOVE) ×1 IMPLANT
GLOVE BIOGEL PI IND STRL 7.0 (GLOVE) ×1 IMPLANT
GLOVE BIOGEL PI INDICATOR 6.5 (GLOVE) ×2
GLOVE BIOGEL PI INDICATOR 7.0 (GLOVE) ×2
GLOVE ECLIPSE 6.5 STRL STRAW (GLOVE) ×3 IMPLANT
GOWN STRL REUS W/TWL LRG LVL3 (GOWN DISPOSABLE) ×6 IMPLANT
LOOP ANGLED CUTTING 22FR (CUTTING LOOP) IMPLANT
PACK VAGINAL MINOR WOMEN LF (CUSTOM PROCEDURE TRAY) ×3 IMPLANT
PAD OB MATERNITY 4.3X12.25 (PERSONAL CARE ITEMS) ×3 IMPLANT
SEAL ROD LENS SCOPE MYOSURE (ABLATOR) ×3 IMPLANT
TOWEL OR 17X24 6PK STRL BLUE (TOWEL DISPOSABLE) ×6 IMPLANT
TUBING AQUILEX INFLOW (TUBING) ×3 IMPLANT
TUBING AQUILEX OUTFLOW (TUBING) ×3 IMPLANT
WATER STERILE IRR 1000ML POUR (IV SOLUTION) ×3 IMPLANT

## 2015-06-21 NOTE — Anesthesia Procedure Notes (Signed)
Procedure Name: LMA Insertion Date/Time: 06/21/2015 11:40 AM Performed by: Flossie Dibble Pre-anesthesia Checklist: Patient identified, Patient being monitored, Timeout performed, Emergency Drugs available and Suction available Patient Re-evaluated:Patient Re-evaluated prior to inductionOxygen Delivery Method: Circle system utilized Preoxygenation: Pre-oxygenation with 100% oxygen Intubation Type: IV induction Ventilation: Mask ventilation without difficulty LMA: LMA inserted LMA Size: 4.0 Number of attempts: 1 Placement Confirmation: breath sounds checked- equal and bilateral and positive ETCO2 Tube secured with: Tape Dental Injury: Teeth and Oropharynx as per pre-operative assessment

## 2015-06-21 NOTE — Brief Op Note (Signed)
06/21/2015  12:02 PM  PATIENT:  [name]  45 y.o. female  PRE-OPERATIVE DIAGNOSIS:  MENORRHAGIA  POST-OPERATIVE DIAGNOSIS:  MENORRHAGIA, anterior uterine polyp  PROCEDURE:  Procedure(s): DILATATION & CURETTAGE/HYSTEROSCOPY WITH POSSIBLE MYOSURE (N/A)  SURGEON:  Surgeon(s) and Role:    * [name], DO - Primary   ANESTHESIA:   local and MAC  EBL:   0  LOCAL MEDICATIONS USED:  LIDOCAINE  and Amount: 10 ml  SPECIMEN:  Source of Specimen:  EMC  DISPOSITION OF SPECIMEN:  PATHOLOGY  COUNTS:  YES  PLAN OF CARE: Discharge to home after PACU  PATIENT DISPOSITION:  PACU - hemodynamically stable.   Delay start of Pharmacological VTE agent (>24hrs) due to surgical blood loss or risk of bleeding: not applicable  FINDINGS: uterus sounded to approx 8, anterior pedunculated polyp, no visible fibroid protruding into cavity, both ostia visualized and normal, no other endometrial abnormality

## 2015-06-21 NOTE — Anesthesia Postprocedure Evaluation (Signed)
Anesthesia Post Note  Patient: Elizabeth Kennedy  Procedure(s) Performed: Procedure(s) (LRB): DILATATION & CURETTAGE/HYSTEROSCOPY WITH MYOSURE (N/A)  Patient location during evaluation: PACU Anesthesia Type: General Level of consciousness: awake and alert Pain management: pain level controlled Vital Signs Assessment: post-procedure vital signs reviewed and stable Respiratory status: spontaneous breathing, nonlabored ventilation, respiratory function stable and patient connected to nasal cannula oxygen Cardiovascular status: blood pressure returned to baseline and stable Postop Assessment: no signs of nausea or vomiting Anesthetic complications: no     Last Vitals:  Filed Vitals:   06/21/15 1330 06/21/15 1345  BP: 131/71 116/59  Pulse: 72 73  Temp:  36.7 C  Resp: 14 16    Last Pain:  Filed Vitals:   06/21/15 1347  PainSc: 3    Pain Goal: Patients Stated Pain Goal: 8 (06/21/15 1033)               Salina Stanfield J

## 2015-06-21 NOTE — Discharge Instructions (Signed)

## 2015-06-21 NOTE — H&P (Signed)
45 y.o. complains of heavy vaginal bleeding that started approx 02/2015, reported 04/2015.  US shows 9.7cm x 5.4cm x 6.2cm uterus with 2.9cm fibroid and 2cm L paratuval simple cyst as well as irregular endometrium with possible fundal polyp.  Polyp was noted on prior scan 2014 but pt was having no bleeding issues at that time and declined any further eval/treatment.    Past Medical History  Diagnosis Date  . Migraines   . Fibromyalgia   . 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 Anemia   . SVD (spontaneous vaginal delivery)     x 4  . Seasonal allergies   . GERD (gastroesophageal reflux disease)     diet  controlled, no meds  . IBS (irritable bowel syndrome)     diet controlled. no meds   Past Surgical History  Procedure Laterality Date  . Cholecystectomy    . Tubal ligation    . Mass excision Right 11/22/2012    Procedure: EXCISION RIGHT GROIN MASS ;  Surgeon: Harl Bowie, MD;  Location: [facility];  Service: General;  Laterality: Right;  . Upper gi endoscopy      Social History   Social History  . Marital Status: Single    Spouse Name: N/A  . Number of Children: N/A  . Years of Education: N/A   Occupational History  . Not on file.   Social History Main Topics  . Smoking status: Never Smoker   . Smokeless tobacco: Never Used  . Alcohol Use: No  . Drug Use: No  . Sexual Activity: Yes    Birth Control/ Protection: Surgical   Other Topics Concern  . Not on file   Social History Narrative    No current facility-administered medications on file prior to encounter.   Current Outpatient Prescriptions on File Prior to Encounter  Medication Sig Dispense Refill  . acetaminophen-codeine 120-12 MG/5ML suspension Take 7 mLs by mouth every 8 (eight) hours as needed (Cough). (Patient not taking: Reported on 06/09/2015) 60 mL 0  . ibuprofen (ADVIL,MOTRIN) 200 MG tablet Take 600 mg by mouth every 6 (six) hours as needed for pain.    . Methocarbamol (ROBAXIN PO)  Take 750 mg by mouth at bedtime and may repeat dose one time if needed.     Marland Kitchen omeprazole (PRILOSEC) 40 MG capsule Take 1 capsule by mouth daily for 14 days then 1 capsule daily as needed for reflux. (Patient not taking: Reported on 12/18/2014) 30 capsule 0  . SUMAtriptan (IMITREX) 25 MG tablet Take 25 mg by mouth every 2 (two) hours as needed for migraine. May repeat in 2 hours if headache persists or recurs.    . topiramate (TOPAMAX) 100 MG tablet Take 200 mg by mouth daily.       Allergies  Allergen Reactions  . Morphine And Related Nausea And Vomiting  . Tape Hives    Use paper tape    @VITALS2 @  Lungs: clear to ascultation Cor:  RRR Abdomen:  soft, nontender, nondistended. Ex:  no cords, erythema Pelvic:  Deferred to OR  A:  Menorrhagia   P: All risks, benefits and alternatives d/w patient and she desires to proceed with diagnostic hysteroscopy with dilation and curettage, possible polypectomy, possibly hysteroscopic myomectomy with myosure. [name]

## 2015-06-21 NOTE — Transfer of Care (Signed)
Immediate Anesthesia Transfer of Care Note  Patient: Elizabeth Kennedy  Procedure(s) Performed: Procedure(s): DILATATION & CURETTAGE/HYSTEROSCOPY WITH MYOSURE (N/A)  Patient Location: PACU  Anesthesia Type:General  Level of Consciousness: awake, alert  and oriented  Airway & Oxygen Therapy: Patient Spontanous Breathing and Patient connected to nasal cannula oxygen  Post-op Assessment: Report given to RN and Post -op Vital signs reviewed and stable  Post vital signs: Reviewed and stable  Last Vitals:  Filed Vitals:   06/21/15 1033  BP: 111/56  Pulse: 75  Temp: 36.9 C  Resp: 16    Last Pain: There were no vitals filed for this visit.    Patients Stated Pain Goal: 8 ([id number])  Complications: No apparent anesthesia complications

## 2015-06-21 NOTE — Anesthesia Preprocedure Evaluation (Signed)
Anesthesia Evaluation  Patient identified by MRN, date of birth, ID band Patient awake    Reviewed: Allergy & Precautions, H&P , NPO status , Patient's Chart, lab work & pertinent test results  Airway Mallampati: I  TM Distance: >3 FB Neck ROM: full    Dental no notable dental hx. (+) Teeth Intact   Pulmonary neg pulmonary ROS,    Pulmonary exam normal        Cardiovascular negative cardio ROS Normal cardiovascular exam     Neuro/Psych negative psych ROS   GI/Hepatic Neg liver ROS,   Endo/Other  negative endocrine ROS  Renal/GU negative Renal ROS     Musculoskeletal   Abdominal Normal abdominal exam  (+)   Peds  Hematology   Anesthesia Other Findings   Reproductive/Obstetrics negative OB ROS                             Anesthesia Physical Anesthesia Plan  ASA: II  Anesthesia Plan: General   Post-op Pain Management:    Induction: Intravenous  Airway Management Planned: LMA  Additional Equipment:   Intra-op Plan:   Post-operative Plan:   Informed Consent: I have reviewed the patients History and Physical, chart, labs and discussed the procedure including the risks, benefits and alternatives for the proposed anesthesia with the patient or authorized representative who has indicated his/her understanding and acceptance.     Plan Discussed with: CRNA and Surgeon  Anesthesia Plan Comments:         Anesthesia Quick Evaluation

## 2015-06-22 ENCOUNTER — Encounter (HOSPITAL_COMMUNITY): Payer: Self-pay | Admitting: Obstetrics and Gynecology

## 2015-07-06 NOTE — Op Note (Signed)
NAMEMarland Kitchen  BENDETTA, [name] NO.:  [id number]  MEDICAL RECORD NO.:  [id number]  LOCATION:  [facility]                          FACILITY:  [facility]  PHYSICIAN:  [name], DO    DATE OF BIRTH:  07/21/1970  DATE OF PROCEDURE:  06/21/2015 DATE OF DISCHARGE:  06/21/2015                              OPERATIVE REPORT   PREOPERATIVE DIAGNOSIS:  Menorrhagia.  POSTOPERATIVE DIAGNOSES:  Menorrhagia and intrauterine polyp.  PROCEDURES:  Dilation and curettage, and hysteroscopy with MyoSure.  SURGEON:  [name], DO.  ANESTHESIA:  Local and MAC.  ESTIMATED BLOOD LOSS:  Zero, 10 mL of paracervical lidocaine used.  SPECIMENS:  Endometrial curetting.  FINDINGS:  Uterus sounded to approximately 8 cm with what appeared to be an anterior pedunculated polyp with no visible fibroid protruding into the cavity.  Both the tubes were visualized and found to be normal.  No other endometrial abnormalities were noted.  COMPLICATIONS:  None.  CONDITION:  Stable to PACU.  DESCRIPTION OF PROCEDURE:  The patient was taken to the operating room, where anesthesia was administered and found to be adequate.  She was prepped and draped in the normal sterile fashion in dorsal lithotomy position.  A speculum was placed in the vagina and with good visualization of the cervix, the anterior lip was grasped and 10 mL of lidocaine was placed paracervically.  A sound was used to measure the length of the uterus and the cervical canal was then dilated sufficiently to accommodate the MyoSure camera and instrument.  Findings were noted above.  MyoSure was introduced and the polyp was removed with additional sampling of the cavity in all four quadrants.  The specimen was sent to Pathology.  All instruments were removed from the uterus, cervix and vagina.  Excellent hemostasis was noted of the tenaculum site.  The speculum was removed.  The patient tolerated the procedure well.  Sponge, lap, and needle  counts were correct x2.  The patient was taken to recovery in stable condition.          ______________________________ [name], DO     [place]/MEDQ  D:  07/05/2015  T:  07/06/2015  Job:  [id number]

## 2016-03-03 DIAGNOSIS — G43109 Migraine with aura, not intractable, without status migrainosus: Secondary | ICD-10-CM | POA: Diagnosis not present

## 2016-03-03 DIAGNOSIS — R2681 Unsteadiness on feet: Secondary | ICD-10-CM | POA: Diagnosis not present

## 2016-03-03 DIAGNOSIS — G43719 Chronic migraine without aura, intractable, without status migrainosus: Secondary | ICD-10-CM | POA: Diagnosis not present

## 2016-03-03 DIAGNOSIS — R201 Hypoesthesia of skin: Secondary | ICD-10-CM | POA: Diagnosis not present

## 2016-03-03 DIAGNOSIS — R710 Precipitous drop in hematocrit: Secondary | ICD-10-CM | POA: Diagnosis not present

## 2016-03-10 ENCOUNTER — Other Ambulatory Visit: Payer: Self-pay | Admitting: Neurology

## 2016-03-10 DIAGNOSIS — R200 Anesthesia of skin: Secondary | ICD-10-CM

## 2016-03-16 ENCOUNTER — Ambulatory Visit
Admission: RE | Admit: 2016-03-16 | Discharge: 2016-03-16 | Disposition: A | Discharge status: Home / Self Care | Payer: Medicare Other | Source: Ambulatory Visit | Attending: Neurology | Admitting: Neurology

## 2016-03-16 DIAGNOSIS — G43909 Migraine, unspecified, not intractable, without status migrainosus: Secondary | ICD-10-CM | POA: Diagnosis not present

## 2016-03-16 DIAGNOSIS — R200 Anesthesia of skin: Secondary | ICD-10-CM

## 2016-05-01 DIAGNOSIS — G43719 Chronic migraine without aura, intractable, without status migrainosus: Secondary | ICD-10-CM | POA: Diagnosis not present

## 2016-05-01 DIAGNOSIS — R710 Precipitous drop in hematocrit: Secondary | ICD-10-CM | POA: Diagnosis not present

## 2016-05-01 DIAGNOSIS — G43109 Migraine with aura, not intractable, without status migrainosus: Secondary | ICD-10-CM | POA: Diagnosis not present

## 2016-06-15 DIAGNOSIS — G43109 Migraine with aura, not intractable, without status migrainosus: Secondary | ICD-10-CM | POA: Diagnosis not present

## 2016-06-15 DIAGNOSIS — H9313 Tinnitus, bilateral: Secondary | ICD-10-CM | POA: Diagnosis not present

## 2016-06-15 DIAGNOSIS — H9113 Presbycusis, bilateral: Secondary | ICD-10-CM | POA: Diagnosis not present

## 2016-07-11 DIAGNOSIS — R710 Precipitous drop in hematocrit: Secondary | ICD-10-CM | POA: Diagnosis not present

## 2016-07-11 DIAGNOSIS — G43719 Chronic migraine without aura, intractable, without status migrainosus: Secondary | ICD-10-CM | POA: Diagnosis not present

## 2016-11-21 DIAGNOSIS — R102 Pelvic and perineal pain: Secondary | ICD-10-CM | POA: Diagnosis not present

## 2016-11-21 DIAGNOSIS — N946 Dysmenorrhea, unspecified: Secondary | ICD-10-CM | POA: Diagnosis not present

## 2016-11-21 DIAGNOSIS — N926 Irregular menstruation, unspecified: Secondary | ICD-10-CM | POA: Diagnosis not present

## 2016-11-21 DIAGNOSIS — Z6828 Body mass index (BMI) 28.0-28.9, adult: Secondary | ICD-10-CM | POA: Diagnosis not present

## 2016-11-29 DIAGNOSIS — N920 Excessive and frequent menstruation with regular cycle: Secondary | ICD-10-CM | POA: Diagnosis not present

## 2016-12-25 ENCOUNTER — Other Ambulatory Visit: Payer: Self-pay | Admitting: Obstetrics and Gynecology

## 2017-01-01 ENCOUNTER — Encounter (HOSPITAL_BASED_OUTPATIENT_CLINIC_OR_DEPARTMENT_OTHER): Payer: Self-pay | Admitting: *Deleted

## 2017-01-01 ENCOUNTER — Other Ambulatory Visit: Payer: Self-pay

## 2017-01-01 NOTE — Progress Notes (Signed)
NPO AFTER MN W/ EXCEPTION CLEAR LIQUIDS UNTIL 0630 (NO CREAM/ MILK PRODUCTS).  ARRIVE AT 1030.  GETTING CBC AND T&S DONE Monday 01-08-2017.  MAY TAKE MIGRAINE MEDICATION IF NEEDED AM DOS W/ SIPS OF WATER.

## 2017-01-08 ENCOUNTER — Encounter (HOSPITAL_COMMUNITY)
Admission: RE | Admit: 2017-01-08 | Discharge: 2017-01-08 | Disposition: A | Discharge status: Home / Self Care | Payer: Medicare Other | Source: Ambulatory Visit | Attending: Obstetrics and Gynecology | Admitting: Obstetrics and Gynecology

## 2017-01-08 DIAGNOSIS — D259 Leiomyoma of uterus, unspecified: Secondary | ICD-10-CM | POA: Diagnosis not present

## 2017-01-08 DIAGNOSIS — G43909 Migraine, unspecified, not intractable, without status migrainosus: Secondary | ICD-10-CM | POA: Diagnosis not present

## 2017-01-08 DIAGNOSIS — K589 Irritable bowel syndrome without diarrhea: Secondary | ICD-10-CM | POA: Diagnosis not present

## 2017-01-08 DIAGNOSIS — N920 Excessive and frequent menstruation with regular cycle: Secondary | ICD-10-CM | POA: Diagnosis not present

## 2017-01-08 DIAGNOSIS — D509 Iron deficiency anemia, unspecified: Secondary | ICD-10-CM | POA: Diagnosis not present

## 2017-01-08 DIAGNOSIS — Z79899 Other long term (current) drug therapy: Secondary | ICD-10-CM | POA: Diagnosis not present

## 2017-01-08 LAB — CBC
HEMATOCRIT: 38.6 % (ref 36.0–46.0)
HEMOGLOBIN: 12.7 g/dL (ref 12.0–15.0)
MCH: 30.1 pg (ref 26.0–34.0)
MCHC: 32.9 g/dL (ref 30.0–36.0)
MCV: 91.5 fL (ref 78.0–100.0)
Platelets: 227 10*3/uL (ref 150–400)
RBC: 4.22 MIL/uL (ref 3.87–5.11)
RDW: 15.5 % (ref 11.5–15.5)
WBC: 5.1 10*3/uL (ref 4.0–10.5)

## 2017-01-08 LAB — ABO/RH: ABO/RH(D): O POS

## 2017-01-10 ENCOUNTER — Ambulatory Visit (HOSPITAL_BASED_OUTPATIENT_CLINIC_OR_DEPARTMENT_OTHER): Payer: Medicare Other | Admitting: Anesthesiology

## 2017-01-10 ENCOUNTER — Encounter (HOSPITAL_BASED_OUTPATIENT_CLINIC_OR_DEPARTMENT_OTHER): Payer: Self-pay | Admitting: *Deleted

## 2017-01-10 ENCOUNTER — Encounter (HOSPITAL_BASED_OUTPATIENT_CLINIC_OR_DEPARTMENT_OTHER)
Admission: RE | Disposition: A | Discharge status: Home / Self Care | Payer: Self-pay | Source: Ambulatory Visit | Attending: Obstetrics and Gynecology

## 2017-01-10 ENCOUNTER — Ambulatory Visit (HOSPITAL_BASED_OUTPATIENT_CLINIC_OR_DEPARTMENT_OTHER)
Admission: RE | Admit: 2017-01-10 | Discharge: 2017-01-10 | Disposition: A | Discharge status: Home / Self Care | Payer: Medicare Other | Source: Ambulatory Visit | Attending: Obstetrics and Gynecology | Admitting: Obstetrics and Gynecology

## 2017-01-10 DIAGNOSIS — D259 Leiomyoma of uterus, unspecified: Secondary | ICD-10-CM | POA: Diagnosis not present

## 2017-01-10 DIAGNOSIS — Z79899 Other long term (current) drug therapy: Secondary | ICD-10-CM | POA: Diagnosis not present

## 2017-01-10 DIAGNOSIS — D509 Iron deficiency anemia, unspecified: Secondary | ICD-10-CM | POA: Diagnosis not present

## 2017-01-10 DIAGNOSIS — D261 Other benign neoplasm of corpus uteri: Secondary | ICD-10-CM | POA: Diagnosis not present

## 2017-01-10 DIAGNOSIS — G43909 Migraine, unspecified, not intractable, without status migrainosus: Secondary | ICD-10-CM | POA: Diagnosis not present

## 2017-01-10 DIAGNOSIS — N920 Excessive and frequent menstruation with regular cycle: Secondary | ICD-10-CM | POA: Insufficient documentation

## 2017-01-10 DIAGNOSIS — K589 Irritable bowel syndrome without diarrhea: Secondary | ICD-10-CM | POA: Insufficient documentation

## 2017-01-10 DIAGNOSIS — N926 Irregular menstruation, unspecified: Secondary | ICD-10-CM | POA: Diagnosis not present

## 2017-01-10 HISTORY — DX: Presence of spectacles and contact lenses: Z97.3

## 2017-01-10 HISTORY — DX: Iron deficiency anemia, unspecified: D50.9

## 2017-01-10 HISTORY — DX: Excessive and frequent menstruation with regular cycle: N92.0

## 2017-01-10 HISTORY — PX: DILITATION & CURRETTAGE/HYSTROSCOPY WITH NOVASURE ABLATION: SHX5568

## 2017-01-10 HISTORY — DX: Other specified postprocedural states: Z98.890

## 2017-01-10 HISTORY — DX: Personal history of other medical treatment: Z92.89

## 2017-01-10 HISTORY — DX: Unspecified osteoarthritis, unspecified site: M19.90

## 2017-01-10 LAB — TYPE AND SCREEN
ABO/RH(D): O POS
ANTIBODY SCREEN: NEGATIVE

## 2017-01-10 SURGERY — DILATATION & CURETTAGE/HYSTEROSCOPY WITH NOVASURE ABLATION
Anesthesia: General

## 2017-01-10 MED ORDER — DEXAMETHASONE SODIUM PHOSPHATE 10 MG/ML IJ SOLN
INTRAMUSCULAR | Status: DC | PRN
  Administered 2017-01-10: 10 mg via INTRAVENOUS

## 2017-01-10 MED ORDER — ONDANSETRON HCL 4 MG/2ML IJ SOLN
INTRAMUSCULAR | Status: AC
  Filled 2017-01-10: qty 2

## 2017-01-10 MED ORDER — SODIUM CHLORIDE 0.9 % IR SOLN
Status: DC | PRN
  Administered 2017-01-10: 3000 mL

## 2017-01-10 MED ORDER — FENTANYL CITRATE (PF) 100 MCG/2ML IJ SOLN
INTRAMUSCULAR | Status: DC | PRN
  Administered 2017-01-10: 25 ug via INTRAVENOUS
  Administered 2017-01-10: 50 ug via INTRAVENOUS

## 2017-01-10 MED ORDER — OXYCODONE HCL 5 MG PO TABS
ORAL_TABLET | ORAL | Status: AC
  Filled 2017-01-10: qty 1

## 2017-01-10 MED ORDER — KETOROLAC TROMETHAMINE 30 MG/ML IJ SOLN
INTRAMUSCULAR | Status: AC
  Filled 2017-01-10: qty 1

## 2017-01-10 MED ORDER — ONDANSETRON HCL 4 MG/2ML IJ SOLN
INTRAMUSCULAR | Status: DC | PRN
  Administered 2017-01-10: 4 mg via INTRAVENOUS

## 2017-01-10 MED ORDER — PROMETHAZINE HCL 25 MG/ML IJ SOLN
INTRAMUSCULAR | Status: AC
  Filled 2017-01-10: qty 1

## 2017-01-10 MED ORDER — LIDOCAINE 2% (20 MG/ML) 5 ML SYRINGE
INTRAMUSCULAR | Status: AC
  Filled 2017-01-10: qty 10

## 2017-01-10 MED ORDER — FENTANYL CITRATE (PF) 100 MCG/2ML IJ SOLN
25.0000 ug | INTRAMUSCULAR | Status: DC | PRN
  Filled 2017-01-10: qty 1

## 2017-01-10 MED ORDER — PROPOFOL 10 MG/ML IV BOLUS
INTRAVENOUS | Status: DC | PRN
  Administered 2017-01-10: 50 mg via INTRAVENOUS
  Administered 2017-01-10: 150 mg via INTRAVENOUS

## 2017-01-10 MED ORDER — MIDAZOLAM HCL 2 MG/2ML IJ SOLN
INTRAMUSCULAR | Status: AC
  Filled 2017-01-10: qty 2

## 2017-01-10 MED ORDER — LACTATED RINGERS IV SOLN
INTRAVENOUS | Status: DC
  Administered 2017-01-10: 1000 mL via INTRAVENOUS
  Administered 2017-01-10: 11:00:00 via INTRAVENOUS
  Filled 2017-01-10: qty 1000

## 2017-01-10 MED ORDER — PROMETHAZINE HCL 25 MG/ML IJ SOLN
6.2500 mg | INTRAMUSCULAR | Status: DC | PRN
  Administered 2017-01-10: 12.5 mg via INTRAVENOUS
  Filled 2017-01-10: qty 1

## 2017-01-10 MED ORDER — OXYCODONE HCL 5 MG/5ML PO SOLN
5.0000 mg | Freq: Once | ORAL | Status: AC | PRN
  Filled 2017-01-10: qty 5

## 2017-01-10 MED ORDER — OXYCODONE HCL 5 MG PO TABS
5.0000 mg | ORAL_TABLET | Freq: Once | ORAL | Status: AC | PRN
  Administered 2017-01-10: 5 mg via ORAL
  Filled 2017-01-10: qty 1

## 2017-01-10 MED ORDER — ONDANSETRON HCL 4 MG/2ML IJ SOLN
INTRAMUSCULAR | Status: AC
Start: 2017-01-10 — End: ?
  Filled 2017-01-10: qty 2

## 2017-01-10 MED ORDER — LIDOCAINE 2% (20 MG/ML) 5 ML SYRINGE
INTRAMUSCULAR | Status: DC | PRN
  Administered 2017-01-10: 60 mg via INTRAVENOUS

## 2017-01-10 MED ORDER — DEXAMETHASONE SODIUM PHOSPHATE 10 MG/ML IJ SOLN
INTRAMUSCULAR | Status: AC
  Filled 2017-01-10: qty 1

## 2017-01-10 MED ORDER — FENTANYL CITRATE (PF) 100 MCG/2ML IJ SOLN
INTRAMUSCULAR | Status: AC
  Filled 2017-01-10: qty 2

## 2017-01-10 MED ORDER — MIDAZOLAM HCL 2 MG/2ML IJ SOLN
INTRAMUSCULAR | Status: DC | PRN
  Administered 2017-01-10: 2 mg via INTRAVENOUS

## 2017-01-10 MED ORDER — KETOROLAC TROMETHAMINE 30 MG/ML IJ SOLN
30.0000 mg | Freq: Once | INTRAMUSCULAR | Status: DC | PRN
  Filled 2017-01-10: qty 1

## 2017-01-10 MED ORDER — LIDOCAINE HCL 1 % IJ SOLN
INTRAMUSCULAR | Status: DC | PRN
Start: 2017-01-10 — End: 2017-01-10
  Administered 2017-01-10: 10 mL

## 2017-01-10 MED ORDER — KETOROLAC TROMETHAMINE 30 MG/ML IJ SOLN
INTRAMUSCULAR | Status: DC | PRN
  Administered 2017-01-10: 30 mg via INTRAVENOUS

## 2017-01-10 MED ORDER — PROPOFOL 10 MG/ML IV BOLUS
INTRAVENOUS | Status: AC
Start: 2017-01-10 — End: ?
  Filled 2017-01-10: qty 20

## 2017-01-10 MED FILL — OXYCOD/ACETAMINOPHEN 5-325M: 5-325 | 2 days supply | Qty: 10 | Fill #0

## 2017-01-10 SURGICAL SUPPLY — 15 items
ABLATOR ENDOMETRIAL BIPOLAR (ABLATOR) ×3 IMPLANT
CANISTER SUCT 3000ML PPV (MISCELLANEOUS) ×3 IMPLANT
CATH ROBINSON RED A/P 16FR (CATHETERS) ×3 IMPLANT
CONTAINER PREFILL 10% NBF 60ML (FORM) ×3 IMPLANT
GLOVE BIOGEL PI IND STRL 6.5 (GLOVE) ×2 IMPLANT
GLOVE BIOGEL PI IND STRL 7.0 (GLOVE) ×1 IMPLANT
GLOVE BIOGEL PI INDICATOR 6.5 (GLOVE) ×4
GLOVE BIOGEL PI INDICATOR 7.0 (GLOVE) ×2
GLOVE ECLIPSE 6.5 STRL STRAW (GLOVE) ×3 IMPLANT
GOWN STRL REUS W/TWL LRG LVL3 (GOWN DISPOSABLE) ×6 IMPLANT
PACK VAGINAL MINOR WOMEN LF (CUSTOM PROCEDURE TRAY) ×3 IMPLANT
PAD OB MATERNITY 4.3X12.25 (PERSONAL CARE ITEMS) ×3 IMPLANT
TOWEL OR 17X24 6PK STRL BLUE (TOWEL DISPOSABLE) ×6 IMPLANT
TUBING AQUILEX INFLOW (TUBING) ×3 IMPLANT
TUBING AQUILEX OUTFLOW (TUBING) ×3 IMPLANT

## 2017-01-10 NOTE — Anesthesia Postprocedure Evaluation (Signed)
Anesthesia Post Note  Patient: Elizabeth Kennedy  Procedure(s) Performed: DILATATION & CURETTAGE/HYSTEROSCOPY WITH NOVASURE ABLATION (N/A )     Patient location during evaluation: PACU Anesthesia Type: General Level of consciousness: awake and alert Pain management: pain level controlled Vital Signs Assessment: post-procedure vital signs reviewed and stable Respiratory status: spontaneous breathing, nonlabored ventilation, respiratory function stable and patient connected to nasal cannula oxygen Cardiovascular status: blood pressure returned to baseline and stable Postop Assessment: no apparent nausea or vomiting Anesthetic complications: no    Last Vitals:  Vitals:   01/10/17 1352 01/10/17 1400  BP:    Pulse: 63 64  Resp: 11 19  Temp:    SpO2: 100% 100%    Last Pain:  Vitals:   01/10/17 1352  TempSrc:   PainSc: 5                  Tiffanni Scarfo S

## 2017-01-10 NOTE — Anesthesia Procedure Notes (Signed)
Procedure Name: LMA Insertion Date/Time: 01/10/2017 12:28 PM Performed by: Wanita Chamberlain, CRNA Pre-anesthesia Checklist: Emergency Drugs available, Patient identified, Suction available, Patient being monitored and Timeout performed Patient Re-evaluated:Patient Re-evaluated prior to induction Oxygen Delivery Method: Circle system utilized Preoxygenation: Pre-oxygenation with 100% oxygen Induction Type: IV induction Ventilation: Mask ventilation without difficulty LMA: LMA inserted LMA Size: 4.0 Number of attempts: 1 Placement Confirmation: breath sounds checked- equal and bilateral and positive ETCO2 Tube secured with: Tape Dental Injury: Teeth and Oropharynx as per pre-operative assessment

## 2017-01-10 NOTE — Anesthesia Preprocedure Evaluation (Addendum)
Anesthesia Evaluation  Patient identified by MRN, date of birth, ID band Patient awake    Reviewed: Allergy & Precautions, NPO status , Patient's Chart, lab work & pertinent test results  Airway Mallampati: II  TM Distance: >3 FB Neck ROM: Full    Dental no notable dental hx.    Pulmonary neg pulmonary ROS,    Pulmonary exam normal breath sounds clear to auscultation       Cardiovascular negative cardio ROS Normal cardiovascular exam Rhythm:Regular Rate:Normal     Neuro/Psych  Headaches, Slight L Hand numbness  Neuromuscular disease negative neurological ROS  negative psych ROS   GI/Hepatic negative GI ROS, Neg liver ROS,   Endo/Other  negative endocrine ROS  Renal/GU negative Renal ROS  negative genitourinary   Musculoskeletal negative musculoskeletal ROS (+) Arthritis , Fibromyalgia -  Abdominal   Peds negative pediatric ROS (+)  Hematology negative hematology ROS (+) anemia ,   Anesthesia Other Findings   Reproductive/Obstetrics negative OB ROS                            Anesthesia Physical Anesthesia Plan  ASA: II  Anesthesia Plan: General   Post-op Pain Management:    Induction: Intravenous  PONV Risk Score and Plan: 3 and Ondansetron, Dexamethasone and Treatment may vary due to age or medical condition  Airway Management Planned: LMA  Additional Equipment:   Intra-op Plan:   Post-operative Plan:   Informed Consent: I have reviewed the patients History and Physical, chart, labs and discussed the procedure including the risks, benefits and alternatives for the proposed anesthesia with the patient or authorized representative who has indicated his/her understanding and acceptance.   Dental advisory given  Plan Discussed with: CRNA and Surgeon  Anesthesia Plan Comments:         Anesthesia Quick Evaluation

## 2017-01-10 NOTE — Brief Op Note (Signed)
01/10/2017  1:30 PM  PATIENT:  Elizabeth Kennedy  46 y.o. female  PRE-OPERATIVE DIAGNOSIS:  menorrhagia  POST-OPERATIVE DIAGNOSIS:  menorrhagia  PROCEDURE:  Procedure(s): DILATATION & CURETTAGE/HYSTEROSCOPY WITH NOVASURE ABLATION (N/A)  SURGEON:  Surgeon(s) and Role:    Rogue Bussing, Jamesia Linnen, DO - Primary  ANESTHESIA:   local and general  EBL:  20 mL   LOCAL MEDICATIONS USED:  LIDOCAINE  and Amount: 10 ml paracervical  SPECIMEN:  Source of Specimen:  EMB  DISPOSITION OF SPECIMEN:  PATHOLOGY  COUNTS:  YES  PLAN OF CARE: Discharge to home after PACU  PATIENT DISPOSITION:  PACU- stable condition   Delay start of Pharmacological VTE agent (>24hrs) due to surgical blood loss or risk of bleeding: not applicable  FINDINGS: uterus sounded to 10cm.  Cervical length 5cm, Cavity length 5cm, Cavity width 4.5cm Ablation time 75min 29sec.  Power 124watts.  Deficit 135cc.  Normal appearance of endometrium and uterine ostea.

## 2017-01-10 NOTE — Transfer of Care (Signed)
Immediate Anesthesia Transfer of Care Note  Patient: Elizabeth Kennedy  Procedure(s) Performed: DILATATION & CURETTAGE/HYSTEROSCOPY WITH NOVASURE ABLATION (N/A )  Patient Location: PACU  Anesthesia Type:General  Level of Consciousness: awake, alert , oriented and patient cooperative  Airway & Oxygen Therapy: Patient Spontanous Breathing and Patient connected to nasal cannula oxygen  Post-op Assessment: Report given to RN and Post -op Vital signs reviewed and stable  Post vital signs: Reviewed and stable  Last Vitals:  Vitals:   01/10/17 1031  BP: (!) 117/57  Pulse: 66  Resp: 16  Temp: 36.4 C  SpO2: 100%    Last Pain:  Vitals:   01/10/17 1054  TempSrc:   PainSc: 1       Patients Stated Pain Goal: 5 (35/82/51 8984)  Complications: No apparent anesthesia complications

## 2017-01-10 NOTE — H&P (Signed)
46 y.o.  complains of heavy periods.  She first reported this 02/2015.  US revealed 9.7 x 5.4 x 6.2cm uterus with possible thickened endometrium/possible polyp, with normal adnexa and small paratubal cyst.  2.9cm subserosal fibroid noted.  D&C hysteroscopy 06/2015 revealed no polyp and EMB was benign.  Pt reported that heavy periods improved for a time and as of the last few months have gotten heavy again.  Repeat US showed 10.6 x 7.7 x 5cm uterus with 4.5cm suberserosal fibroid and 1.6cm intramural fibroid.  Discussed options for mgmt and pt would like to proceed with D&C hysteroscopy, Novasure endometrial ablation knowing of possible Novasure failure given fibroids present.  Past Medical History:  Diagnosis Date  . Arthritis   . Fibromyalgia    per pt has left hand mild numbness/ weakness and left foot due to fibromyalgia  . History of esophageal dilatation    per pt x5  last one 2012  approx.  Marland Kitchen History of exercise stress test 03-01-2010   dr hochrein   no evidence ischemia/  also Stress Echo test done-- with stress there was a thickening and contractility in all segments, there is decrease in cavity size in all views; this is a normal stress echo study;  normal LV global function and wall motion, ef 555%  . IBS (irritable bowel syndrome)    diet controlled. no meds  . Iron deficiency anemia   . Menorrhagia   . Migraines   . Seasonal allergies   . Wears glasses    Past Surgical History:  Procedure Laterality Date  . DILATATION & CURETTAGE/HYSTEROSCOPY WITH MYOSURE N/A 06/21/2015   Procedure: DILATATION & CURETTAGE/HYSTEROSCOPY WITH MYOSURE;  Surgeon: [name], DO;  Location: [facility];  Service: Gynecology;  Laterality: N/A;  . LAPAROSCOPIC CHOLECYSTECTOMY  2008  . MASS EXCISION Right 11/22/2012   Procedure: EXCISION RIGHT GROIN MASS ;  Surgeon: Harl Bowie, MD;  Location: [facility];  Service: General;  Laterality: Right;  . TUBAL LIGATION Bilateral 1995   [facility]  . UPPER GI  ENDOSCOPY  x5  last one 2012  approx.   w/ dilations    Social History   Socioeconomic History  . Marital status: Single    Spouse name: Not on file  . Number of children: Not on file  . Years of education: Not on file  . Highest education level: Not on file  Social Needs  . Financial resource strain: Not on file  . Food insecurity - worry: Not on file  . Food insecurity - inability: Not on file  . Transportation needs - medical: Not on file  . Transportation needs - non-medical: Not on file  Occupational History  . Not on file  Tobacco Use  . Smoking status: Never Smoker  . Smokeless tobacco: Never Used  Substance and Sexual Activity  . Alcohol use: No  . Drug use: No  . Sexual activity: Yes    Birth control/protection: Surgical  Other Topics Concern  . Not on file  Social History Narrative  . Not on file    No current facility-administered medications on file prior to encounter.    Current Outpatient Medications on File Prior to Encounter  Medication Sig Dispense Refill  . acetaminophen (TYLENOL) 500 MG tablet Take 500 mg every 6 (six) hours as needed by mouth.    . Ferrous Sulfate (IRON) 325 (65 Fe) MG TABS Take 1 tablet daily by mouth.    Marland Kitchen ibuprofen (ADVIL,MOTRIN) 200 MG tablet Take 600 mg  by mouth every 6 (six) hours as needed for pain.    Marland Kitchen levETIRAcetam (KEPPRA) 500 MG tablet Take 2,500 mg at bedtime by mouth.    . methocarbamol (ROBAXIN) 500 MG tablet Take 500 mg every 8 (eight) hours as needed by mouth for muscle spasms.    . Probiotic Product (PROBIOTIC DAILY) CAPS Take 1 capsule daily by mouth.    . promethazine (PHENERGAN) 25 MG tablet Take 25 mg every 6 (six) hours as needed by mouth for nausea or vomiting.    . SUMAtriptan (IMITREX) 100 MG tablet Take 100 mg every 2 (two) hours as needed by mouth for migraine. May repeat in 2 hours if headache persists or recurs.      Allergies  Allergen Reactions  . Gluten Meal   . Morphine And Related Nausea And  Vomiting  . Tape Hives    Use paper tape    @VITALS2 @  Lungs: clear to ascultation Cor:  RRR Abdomen:  soft, nontender, nondistended. Ex:  no cords, erythema Pelvic:  Deferred to OR  A:  Menorrhagia and known fibroids   P: All risks, benefits and alternatives d/w patient and she desires to proceed with procedure noted above. No abx needed.  Will place SCDs.     [name]

## 2017-01-10 NOTE — Discharge Instructions (Signed)
° °  D & C Home care Instructions:   Personal hygiene:  Used sanitary napkins for vaginal drainage not tampons. Shower or tub bathe the day after your procedure. No douching until bleeding stops. Always wipe from front to back after  Elimination.  Activity: Do not drive or operate any equipment today. The effects of the anesthesia are still present and drowsiness may result. Rest today, not necessarily flat bed rest, just take it easy. You may resume your normal activity in one to 2 days.  Sexual activity: No intercourse for one week or as indicated by your physician  Diet: Eat a light diet as desired this evening. You may resume a regular diet tomorrow.  Return to work: One to 2 days.  General Expectations of your surgery: Vaginal bleeding should be no heavier than a normal period. Spotting may continue up to 10 days. Mild cramps may continue for a couple of days. You may have a regular period in 2-6 weeks.  Unexpected observations call your doctor if these occur: persistent or heavy bleeding. Severe abdominal cramping or pain. Elevation of temperature greater than 100F.  Call for an appointment in one week.  Do not take any nonsteroidal anti inflammatories (advil, motrin, aleve, Ibuprofen) until after 7:15 pm today.      Post Anesthesia Home Care Instructions  Activity: Get plenty of rest for the remainder of the day. A responsible individual must stay with you for 24 hours following the procedure.  For the next 24 hours, DO NOT: -Drive a car -Paediatric nurse -Drink alcoholic beverages -Take any medication unless instructed by your physician -Make any legal decisions or sign important papers.  Meals: Start with liquid foods such as gelatin or soup. Progress to regular foods as tolerated. Avoid greasy, spicy, heavy foods. If nausea and/or vomiting occur, drink only clear liquids until the nausea and/or vomiting subsides. Call your physician if vomiting continues.  Special  Instructions/Symptoms: Your throat may feel dry or sore from the anesthesia or the breathing tube placed in your throat during surgery. If this causes discomfort, gargle with warm salt water. The discomfort should disappear within 24 hours.  If you had a scopolamine patch placed behind your ear for the management of post- operative nausea and/or vomiting:  1. The medication in the patch is effective for 72 hours, after which it should be removed.  Wrap patch in a tissue and discard in the trash. Wash hands thoroughly with soap and water. 2. You may remove the patch earlier than 72 hours if you experience unpleasant side effects which may include dry mouth, dizziness or visual disturbances. 3. Avoid touching the patch. Wash your hands with soap and water after contact with the patch.

## 2017-01-11 ENCOUNTER — Encounter (HOSPITAL_BASED_OUTPATIENT_CLINIC_OR_DEPARTMENT_OTHER): Payer: Self-pay | Admitting: Obstetrics and Gynecology

## 2017-01-23 NOTE — Op Note (Signed)
NAMELOGANN, Elizabeth Kennedy NO.:  [id number]  MEDICAL RECORD NO.:  [id number]  LOCATION:                                 FACILITY:  PHYSICIAN:  [name], DO    DATE OF BIRTH:  10-17-1970  DATE OF PROCEDURE:  01/10/2017 DATE OF DISCHARGE:  01/10/2017                              OPERATIVE REPORT   PREOPERATIVE DIAGNOSIS:  Heavy menstrual bleeding.  POSTOPERATIVE DIAGNOSIS:  Heavy menstrual bleeding.  PROCEDURE:  Dilation and curettage, hysteroscopy with NovaSure endometrial ablation.  SURGEON:  [name], DO  ANESTHESIA:  General and local.  Local medication used approximately 10 mL of 1% lidocaine paracervical block.  ESTIMATED BLOOD LOSS:  20 mL.  IV FLUIDS AND URINE OUTPUT:  Please see Anesthesia report.  SPECIMENS:  Endometrial biopsy to Pathology.  FINDINGS:  Uterus sounded to 10 cm.  Cervical length 5 cm.  Cavity length 5 cm.  Cavity width 4.5 cm.  Ablation time 1 minute 29 seconds, power 124 watts.  Deficit 135 mL.  Normal appearance of endometrium and uterine ostia.  COMPLICATIONS:  None.  CONDITION:  Stable to PACU.  DESCRIPTION OF PROCEDURE:  The patient was taken to the operating room, where anesthesia was administered and found to be adequate.  She was prepped and draped in a normal sterile fashion in dorsal lithotomy position.  A speculum was placed and the anterior lip of the cervix was grasped with a single-tooth tenaculum.  Uterus was sounded.  Dilation to accommodate NovaSure device performed.  Camera inserted and findings noted above.  The NovaSure device was then inserted and ablation performed.  NovaSure was removed and camera was reinserted with excellent cautery of endometrial cavity noted.  Prior to performing ablation, a curettage was performed in all 4 quadrants and specimen was sent for pathology.  All instruments were then removed from the uterus and vagina.  Tenaculum was removed from the cervix.   Minimal bleeding noted.  The patient tolerated the procedure well.  Sponge, lap, and needle counts were correct x2.  The patient was taken to recovery in stable condition.    ______________________________ [name], DO   ______________________________ [name], DO    [place]/MEDQ  D:  01/22/2017  T:  01/22/2017  Job:  [id number]

## 2017-03-20 ENCOUNTER — Emergency Department (HOSPITAL_COMMUNITY)
Admission: EM | Admit: 2017-03-20 | Discharge: 2017-03-20 | Disposition: A | Discharge status: Home / Self Care | Payer: Medicare Other | Attending: Emergency Medicine | Admitting: Emergency Medicine

## 2017-03-20 ENCOUNTER — Encounter (HOSPITAL_COMMUNITY): Payer: Self-pay | Admitting: Nurse Practitioner

## 2017-03-20 ENCOUNTER — Emergency Department (HOSPITAL_COMMUNITY): Payer: Medicare Other

## 2017-03-20 ENCOUNTER — Other Ambulatory Visit: Payer: Self-pay

## 2017-03-20 DIAGNOSIS — Z79899 Other long term (current) drug therapy: Secondary | ICD-10-CM | POA: Diagnosis not present

## 2017-03-20 DIAGNOSIS — M542 Cervicalgia: Secondary | ICD-10-CM | POA: Insufficient documentation

## 2017-03-20 DIAGNOSIS — R221 Localized swelling, mass and lump, neck: Secondary | ICD-10-CM | POA: Diagnosis not present

## 2017-03-20 LAB — CBC WITH DIFFERENTIAL/PLATELET
Basophils Absolute: 0 10*3/uL (ref 0.0–0.1)
Basophils Relative: 0 %
EOS ABS: 0.2 10*3/uL (ref 0.0–0.7)
EOS PCT: 3 %
HCT: 42.8 % (ref 36.0–46.0)
Hemoglobin: 14.8 g/dL (ref 12.0–15.0)
LYMPHS ABS: 1.8 10*3/uL (ref 0.7–4.0)
LYMPHS PCT: 30 %
MCH: 31.4 pg (ref 26.0–34.0)
MCHC: 34.6 g/dL (ref 30.0–36.0)
MCV: 90.9 fL (ref 78.0–100.0)
MONO ABS: 0.4 10*3/uL (ref 0.1–1.0)
MONOS PCT: 7 %
Neutro Abs: 3.6 10*3/uL (ref 1.7–7.7)
Neutrophils Relative %: 60 %
PLATELETS: 246 10*3/uL (ref 150–400)
RBC: 4.71 MIL/uL (ref 3.87–5.11)
RDW: 13 % (ref 11.5–15.5)
WBC: 5.9 10*3/uL (ref 4.0–10.5)

## 2017-03-20 LAB — I-STAT CHEM 8, ED
BUN: 11 mg/dL (ref 6–20)
CALCIUM ION: 1.23 mmol/L (ref 1.15–1.40)
CHLORIDE: 103 mmol/L (ref 101–111)
Creatinine, Ser: 0.7 mg/dL (ref 0.44–1.00)
Glucose, Bld: 93 mg/dL (ref 65–99)
HCT: 45 % (ref 36.0–46.0)
Hemoglobin: 15.3 g/dL — ABNORMAL HIGH (ref 12.0–15.0)
Potassium: 4.5 mmol/L (ref 3.5–5.1)
Sodium: 141 mmol/L (ref 135–145)
TCO2: 27 mmol/L (ref 22–32)

## 2017-03-20 MED ORDER — IOPAMIDOL (ISOVUE-300) INJECTION 61%
INTRAVENOUS | Status: AC
  Administered 2017-03-20: 75 mL via INTRAVENOUS
  Filled 2017-03-20: qty 75

## 2017-03-20 MED ORDER — DIPHENHYDRAMINE HCL 50 MG/ML IJ SOLN
25.0000 mg | Freq: Once | INTRAMUSCULAR | Status: AC
Start: 2017-03-20 — End: 2017-03-20
  Administered 2017-03-20: 25 mg via INTRAVENOUS
  Filled 2017-03-20: qty 1

## 2017-03-20 MED ORDER — CYCLOBENZAPRINE HCL 10 MG PO TABS: 10.0000 mg | ORAL_TABLET | Freq: Two times a day (BID) | ORAL | 0 refills | Status: DC | PRN

## 2017-03-20 MED ORDER — NAPROXEN 500 MG PO TABS: 500.0000 mg | ORAL_TABLET | Freq: Two times a day (BID) | ORAL | 0 refills | Status: DC

## 2017-03-20 MED ORDER — SODIUM CHLORIDE 0.9 % IJ SOLN
INTRAMUSCULAR | Status: AC
  Filled 2017-03-20: qty 50

## 2017-03-20 NOTE — ED Triage Notes (Signed)
Patient here today for due to swollen neck/jaw on the inside. Patient had a migraine yesterday went to sleep. Woke up at 3am and took Imitrex and phenergan and went back to sleep. When she woke up this morning she feel like her throat was sore and her neck/jaw was swollen. Her lips feel slightly numb and her throat feels tight.

## 2017-03-20 NOTE — Discharge Instructions (Signed)
Your CT scan did not show any signs of neck masses, swelling, or any other abnormalities. I think that your symptoms are caused by sternocleidomastoid muscle strain. Please follow up with family doctor. Return if worsening.

## 2017-03-20 NOTE — ED Provider Notes (Signed)
[facility] DEPT Provider Note   CSN: [id number] Arrival date & time: 03/20/17  3220     History   Chief Complaint No chief complaint on file.   HPI Elizabeth Kennedy is a 47 y.o. female.  HPI Elizabeth Kennedy is a 47 y.o. female history of fibromyalgia, migraine headaches, esophageal strictures, presents to emergency department with complaint of left sided neck swelling.  Patient states that she had a migraine yesterday for which she took Imitrex and Phenergan.  She states that her migraine improved, however she woke up yesterday evening with swelling to the and sensation of her throat swelling.  Patient states that this morning her symptoms seem to get worse so she came to emergency department.  She states she feels swelling right under her left jaw and down her neck.  She states she is not having any difficulty breathing at this time, but feels like she cannot swallow that well.  She is able to swallow solids and liquids.  She denies any fever or chills.  She denies any sore throat.  She states that her neck and throat are painful but just on the left side.  She denies taking any medications prior to coming in.  She did not take any other medications other than Phenergan and Imitrex yesterday.  No history of the same.  Past Medical History:  Diagnosis Date  . Arthritis   . Fibromyalgia    per pt has left hand mild numbness/ weakness and left foot due to fibromyalgia  . History of esophageal dilatation    per pt x5  last one 2012  approx.  Marland Kitchen History of exercise stress test 03-01-2010   dr hochrein   no evidence ischemia/  also Stress Echo test done-- with stress there was a thickening and contractility in all segments, there is decrease in cavity size in all views; this is a normal stress echo study;  normal LV global function and wall motion, ef 555%  . IBS (irritable bowel syndrome)    diet controlled. no meds  . Iron deficiency anemia   .  Menorrhagia   . Migraines   . Seasonal allergies   . Wears glasses     Patient Active Problem List   Diagnosis Date Noted  . Right groin mass 11/12/2012  . CHEST PAIN, ATYPICAL 03/01/2010    Past Surgical History:  Procedure Laterality Date  . DILATATION & CURETTAGE/HYSTEROSCOPY WITH MYOSURE N/A 06/21/2015   Procedure: DILATATION & CURETTAGE/HYSTEROSCOPY WITH MYOSURE;  Surgeon: [name], DO;  Location: [facility];  Service: Gynecology;  Laterality: N/A;  . DILITATION & CURRETTAGE/HYSTROSCOPY WITH NOVASURE ABLATION N/A 01/10/2017   Procedure: DILATATION & CURETTAGE/HYSTEROSCOPY WITH NOVASURE ABLATION;  Surgeon: [name], DO;  Location: [facility];  Service: Gynecology;  Laterality: N/A;  . LAPAROSCOPIC CHOLECYSTECTOMY  2008  . MASS EXCISION Right 11/22/2012   Procedure: EXCISION RIGHT GROIN MASS ;  Surgeon: Harl Bowie, MD;  Location: [facility];  Service: General;  Laterality: Right;  . TUBAL LIGATION Bilateral 1995   PPTL  . UPPER GI ENDOSCOPY  x5  last one 2012  approx.   w/ dilations    OB History    No data available       Home Medications    Prior to Admission medications   Medication Sig Start Date End Date Taking? Authorizing Provider  acetaminophen (TYLENOL) 500 MG tablet Take 500 mg every 6 (six) hours as needed by mouth.  [provider]  Ferrous Sulfate (IRON) 325 (65 Fe) MG TABS Take 1 tablet daily by mouth.    [provider]  ibuprofen (ADVIL,MOTRIN) 200 MG tablet Take 600 mg by mouth every 6 (six) hours as needed for pain.    [provider]  levETIRAcetam (KEPPRA) 500 MG tablet Take 2,500 mg at bedtime by mouth.    [provider]  methocarbamol (ROBAXIN) 500 MG tablet Take 500 mg every 8 (eight) hours as needed by mouth for muscle spasms.    [provider]  Probiotic Product (PROBIOTIC DAILY) CAPS Take 1 capsule daily by mouth.    [provider]  promethazine (PHENERGAN)  25 MG tablet Take 25 mg every 6 (six) hours as needed by mouth for nausea or vomiting.    [provider]  SUMAtriptan (IMITREX) 100 MG tablet Take 100 mg every 2 (two) hours as needed by mouth for migraine. May repeat in 2 hours if headache persists or recurs.    [provider]    Family History Family History  Problem Relation Age of Onset  . Aneurysm Sister     Social History Social History   Tobacco Use  . Smoking status: Never Smoker  . Smokeless tobacco: Never Used  Substance Use Topics  . Alcohol use: No  . Drug use: No     Allergies   Gluten meal; Morphine and related; and Tape   Review of Systems Review of Systems  Constitutional: Negative for chills and fever.  HENT: Positive for trouble swallowing. Negative for congestion, sore throat and voice change.   Respiratory: Negative for cough, chest tightness and shortness of breath.   Cardiovascular: Negative for chest pain, palpitations and leg swelling.  Gastrointestinal: Negative for abdominal pain, diarrhea, nausea and vomiting.  Genitourinary: Negative for dysuria, flank pain and pelvic pain.  Musculoskeletal: Positive for neck pain. Negative for myalgias and neck stiffness.  Skin: Negative for rash.  Neurological: Negative for dizziness, weakness and headaches.  All other systems reviewed and are negative.    Physical Exam Updated Vital Signs BP (!) 111/48 (BP Location: Right Arm)   Pulse 77   Temp 98.6 F (37 C) (Oral)   Resp 16   Ht 5\' 4"  (1.626 m)   Wt 72.6 kg (160 lb)   SpO2 99%   BMI 27.46 kg/m   Physical Exam  Constitutional: She appears well-developed and well-nourished. No distress.  HENT:  Head: Normocephalic.  Normal oropharynx, uvula midline.  No swelling of the lips, tongue, uvula  Eyes: Conjunctivae are normal.  Neck: Neck supple.  No palpable lymphadenopathy or swelling to the left side of the neck on my exam.  Tenderness diffusely over her submandibular  submental area, and down sternocleidomastoid muscle, and anterior and posterior to sternocleidomastoid muscle on the left side.  Pain with range of motion of the neck over the left sternocleidomastoid muscle.  Thyroid appears to be normal.  Cardiovascular: Normal rate, regular rhythm and normal heart sounds.  Pulmonary/Chest: Effort normal and breath sounds normal. No respiratory distress. She has no wheezes. She has no rales.  Abdominal: Soft. Bowel sounds are normal. She exhibits no distension. There is no tenderness. There is no rebound.  Musculoskeletal: She exhibits no edema.  Neurological: She is alert.  Skin: Skin is warm and dry.  Psychiatric: She has a normal mood and affect. Her behavior is normal.  Nursing note and vitals reviewed.    ED Treatments / Results  Labs (all labs ordered  are listed, but only abnormal results are displayed) Labs Reviewed  I-STAT CHEM 8, ED - Abnormal; Notable for the following components:      Result Value   Hemoglobin 15.3 (*)    All other components within normal limits  CBC WITH DIFFERENTIAL/PLATELET    EKG  EKG Interpretation None       Radiology Ct Soft Tissue Neck W Contrast  Result Date: 03/20/2017 CLINICAL DATA:  Neck mass, solitary, afebrile. Patient took Imitrex and Phenergan last night. Upon awakening there was neck and jaw swelling in the lips are numb. EXAM: CT NECK WITH CONTRAST TECHNIQUE: Multidetector CT imaging of the neck was performed using the standard protocol following the bolus administration of intravenous contrast. CONTRAST:  8mL ISOVUE-300 IOPAMIDOL (ISOVUE-300) INJECTION 61% COMPARISON:  None. FINDINGS: Pharynx and larynx: Normal. No mass or swelling. Salivary glands: No inflammation, mass, or stone. Thyroid: Normal. Lymph nodes: None enlarged or abnormal density. Vascular: Negative. Limited intracranial: Negative. Visualized orbits: Negative. Mastoids and visualized paranasal sinuses: Clear. Skeleton: No acute or  aggressive process. Upper chest: Negative. Other: There are 2 small nodules in the subcutaneous lower and posterior left neck, 1 calcified. This could be old inflammatory process or old granulomatous nodal disease IMPRESSION: Negative.  No explanation for symptoms. Electronically Signed   By: Monte Fantasia M.D.   On: 03/20/2017 12:11    Procedures Procedures (including critical care time)  Medications Ordered in ED Medications  diphenhydrAMINE (BENADRYL) injection 25 mg (not administered)     Initial Impression / Assessment and Plan / ED Course  I have reviewed the triage vital signs and the nursing notes.  Pertinent labs & imaging results that were available during my care of the patient were reviewed by me and considered in my medical decision making (see chart for details).     Patient with left-sided neck pain and difficulty swallowing.  She is in no acute distress.  Vital signs are normal.  Will give Benadryl for possible allergic reaction, will get CT soft tissue neck to rule out masses, swelling, airway compromise.  I suspect however her pain could be musculoskeletal with significant tenderness over her sternocleidomastoid muscle.   CT negative. Discussed results with pt. Again no obvious swelling of lips, tongue, throat. I do not appreciate any neck swelling. Most likely muscular strain. pt does admit to going to gym yesterday, but reports no injuries. Will treat with nsaids, muscle relaxants.  His follow-up and return precautions discussed.  Vitals:   03/20/17 0746 03/20/17 0808 03/20/17 1302  BP: (!) 111/48  (!) 106/47  Pulse: 77  76  Resp: 16  16  Temp: 98.6 F (37 C)    TempSrc: Oral    SpO2: 99%    Weight: 72.6 kg (160 lb) 72.6 kg (160 lb)   Height: 5\' 4"  (1.626 m) 5\' 4"  (1.626 m)      Final Clinical Impressions(s) / ED Diagnoses   Final diagnoses:  Neck pain    ED Discharge Orders        Ordered    cyclobenzaprine (FLEXERIL) 10 MG tablet  2 times daily  PRN     03/20/17 1224    naproxen (NAPROSYN) 500 MG tablet  2 times daily     03/20/17 1224       Jeannett Senior, PA-C 03/20/17 1427    Sherwood Gambler, MD 03/20/17 1553

## 2017-05-04 IMAGING — MR MR HEAD W/O CM
8 of 10 series · 38 of 48 positions shown · non-contrast
Comparison: Head CT without contrast 2046 hours today. Brain [REDACTED] 04/13/2014.

CLINICAL DATA: 44-year-old female with left facial numbness x3
hours with visual changes. Initial encounter.

EXAM:
MRI HEAD WITHOUT CONTRAST
TECHNIQUE: Multiplanar, multiecho pulse sequences of the brain and surrounding
structures were obtained without intravenous contrast.

[Series 3: T1 · sagittal · 5.0mm · 0.47mm/px · 2 of 24 slices shown]
[im 1/24]
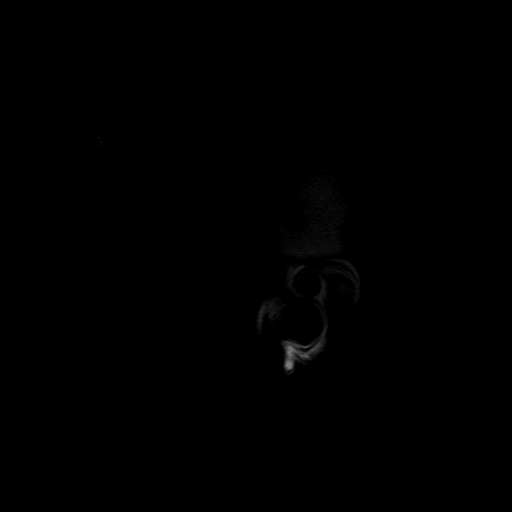
[im 24/24]
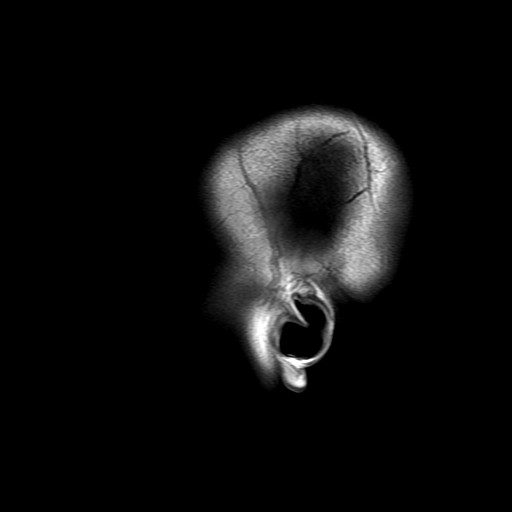

[Series 4: DWI · axial · 3.0mm · 1.09mm/px · z∈[-57,+76]mm · 11 of 94 slices shown (1 of 4)]
[im 1/94]
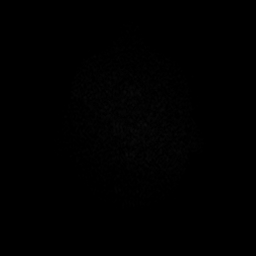
[im 10/94]
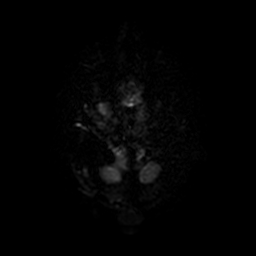
[im 19/94]
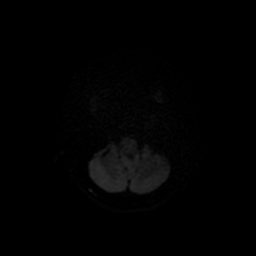
[im 28/94]
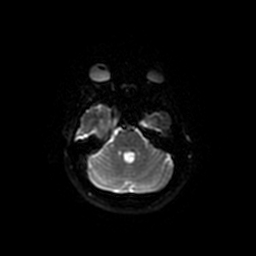
[im 38/94]
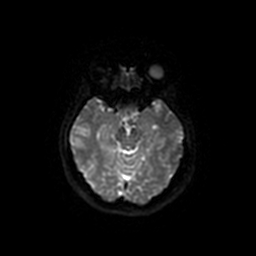
[im 47/94]
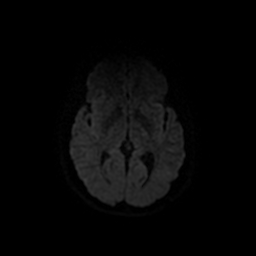
[im 56/94]
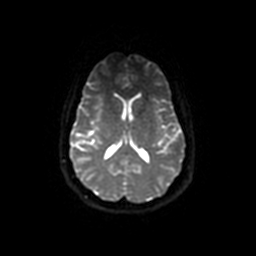
[im 66/94]
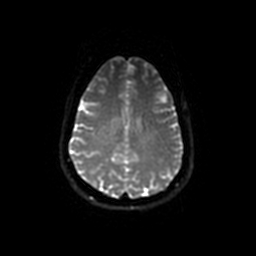
[im 75/94]
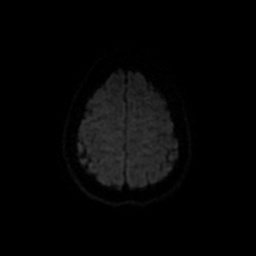
[im 84/94]
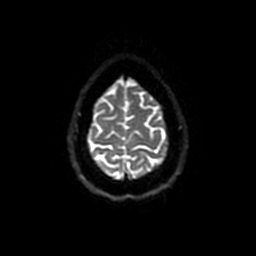
[im 94/94]
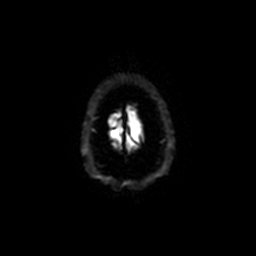

[Series 5: DWI · coronal · 5.0mm · 1.09mm/px · 7 of 60 slices shown (2 of 4)]
[im 1/60]
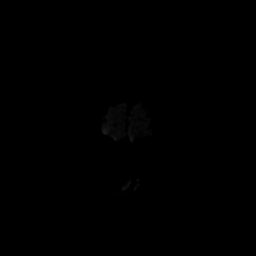
[im 10/60]
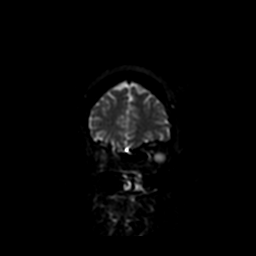
[im 20/60]
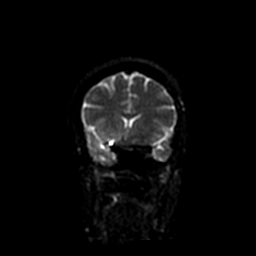
[im 30/60]
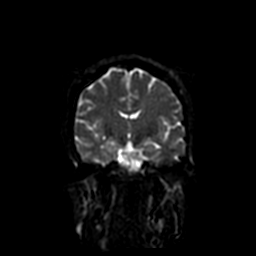
[im 40/60]
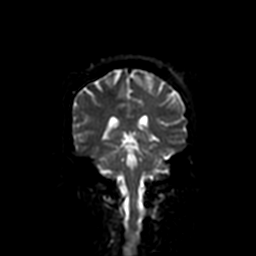
[im 50/60]
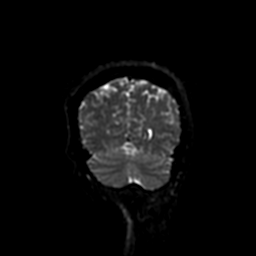
[im 60/60]
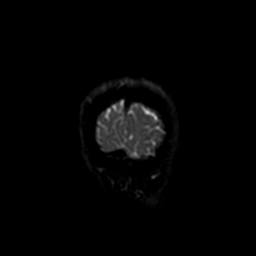

[Series 6: T2 · axial · 5.0mm · 0.47mm/px · z∈[-76,+77]mm · 3 of 24 slices shown (1 of 2)]
[im 1/24]
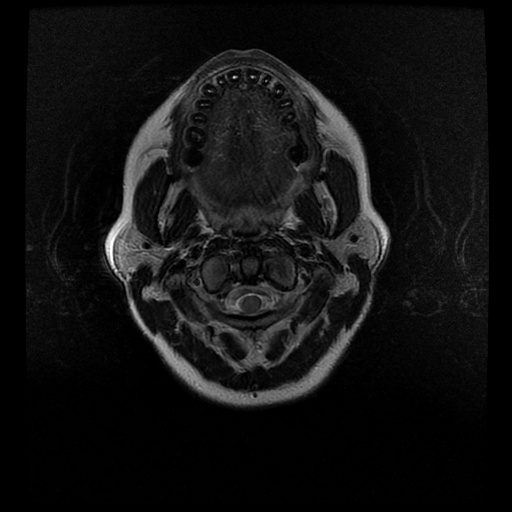
[im 12/24]
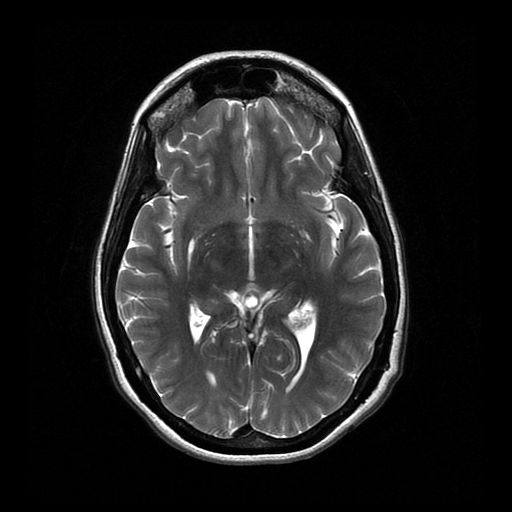
[im 24/24]
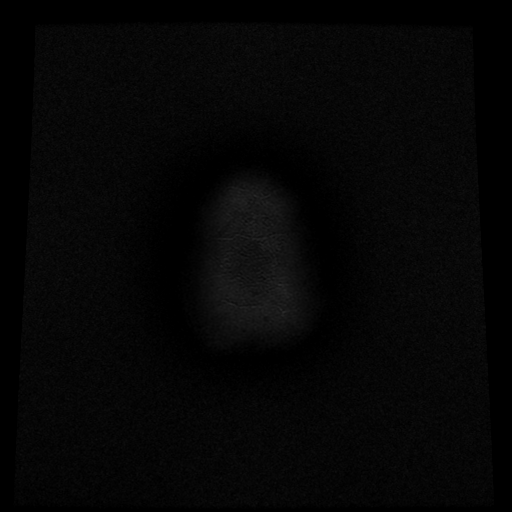

[Series 7: FLAIR · axial · 5.0mm · 0.47mm/px · z∈[-76,+77]mm · 3 of 24 slices shown]
[im 1/24]
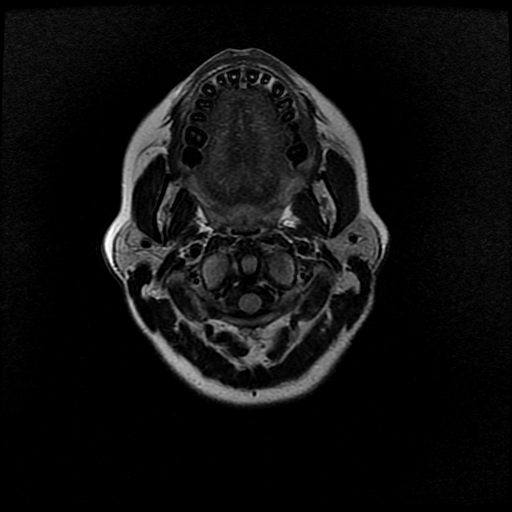
[im 12/24]
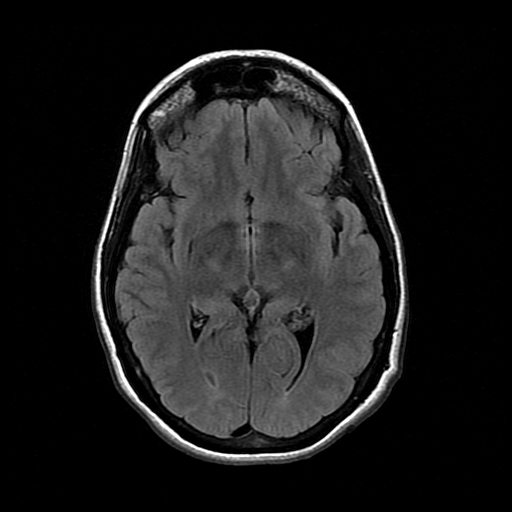
[im 24/24]
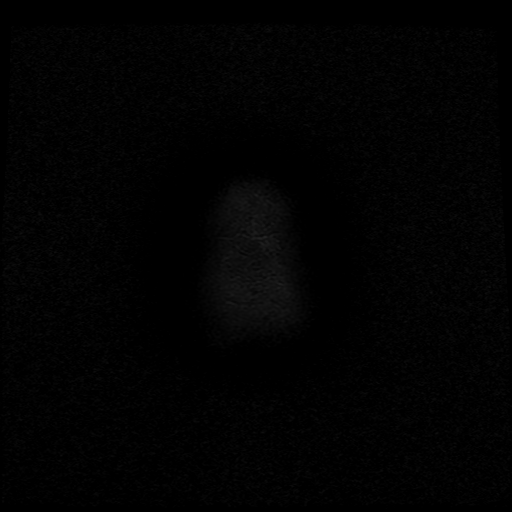

[Series 10: T2 · coronal · 5.0mm · 0.47mm/px · 3 of 24 slices shown (2 of 2)]
[im 1/24]
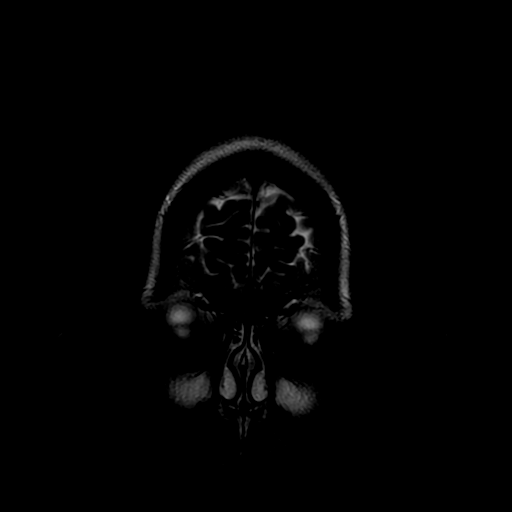
[im 12/24]
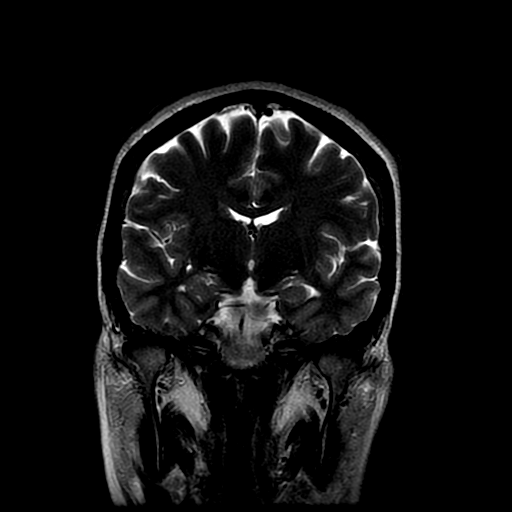
[im 24/24]
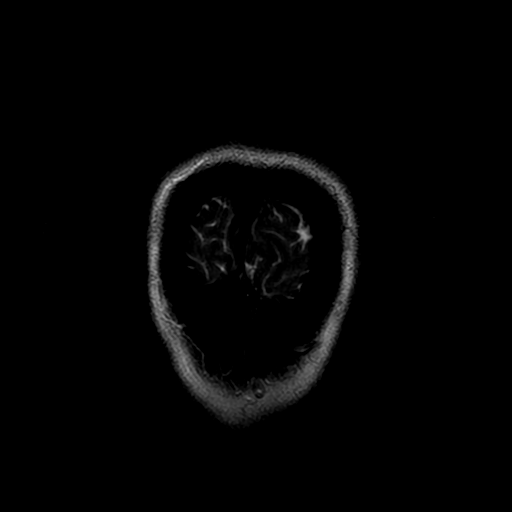

[Series 400: DWI · axial · 3.0mm · 1.09mm/px · z∈[-57,+76]mm · 5 of 47 slices shown (3 of 4)]
[im 1/47]
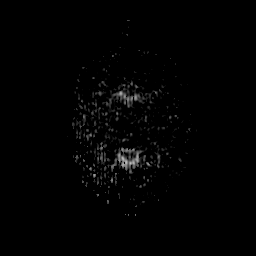
[im 12/47]
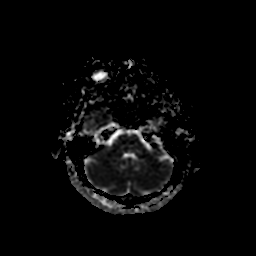
[im 24/47]
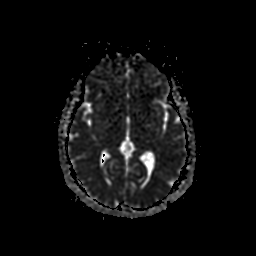
[im 35/47]
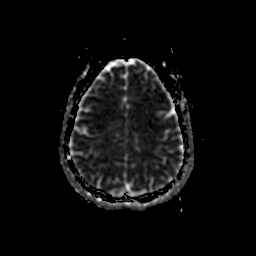
[im 47/47]
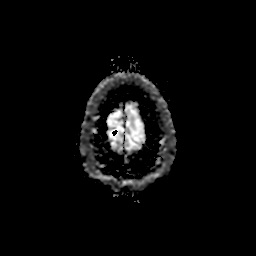

[Series 500: DWI · coronal · 5.0mm · 1.09mm/px · 4 of 30 slices shown (4 of 4)]
[im 1/30]
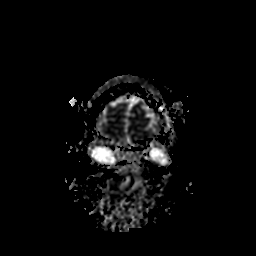
[im 10/30]
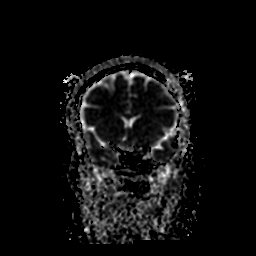
[im 20/30]
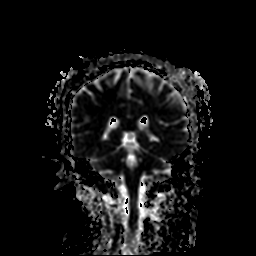
[im 30/30]
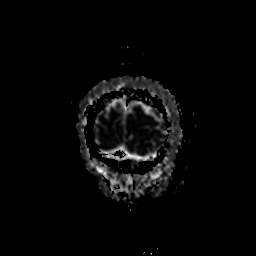

[38 of 48 positions shown; findings below may reference images not displayed]

FINDINGS: Major intracranial vascular flow voids are stable and within normal
limits. No restricted diffusion to suggest acute infarction. No
midline shift, mass effect, evidence of mass lesion,
ventriculomegaly, extra-axial collection or acute intracranial
hemorrhage. Cervicomedullary junction and pituitary are within
normal limits.

Stable and normal gray and white matter signal throughout the brain.

Visualized internal auditory structures appear stable and within
normal limits. Mastoids remain clear. Stable trace paranasal sinus
mucosal thickening. Negative orbit and scalp soft tissues. Negative
visualized cervical spine. Bone marrow signal stable and within
normal limits.
IMPRESSION: Stable and negative noncontrast MRI appearance of the brain.

## 2017-07-02 DIAGNOSIS — M542 Cervicalgia: Secondary | ICD-10-CM | POA: Diagnosis not present

## 2017-07-02 DIAGNOSIS — G43009 Migraine without aura, not intractable, without status migrainosus: Secondary | ICD-10-CM | POA: Diagnosis not present

## 2017-07-02 DIAGNOSIS — M9901 Segmental and somatic dysfunction of cervical region: Secondary | ICD-10-CM | POA: Diagnosis not present

## 2017-07-02 DIAGNOSIS — M9902 Segmental and somatic dysfunction of thoracic region: Secondary | ICD-10-CM | POA: Diagnosis not present

## 2017-07-04 DIAGNOSIS — G43009 Migraine without aura, not intractable, without status migrainosus: Secondary | ICD-10-CM | POA: Diagnosis not present

## 2017-07-04 DIAGNOSIS — M9902 Segmental and somatic dysfunction of thoracic region: Secondary | ICD-10-CM | POA: Diagnosis not present

## 2017-07-04 DIAGNOSIS — M9901 Segmental and somatic dysfunction of cervical region: Secondary | ICD-10-CM | POA: Diagnosis not present

## 2017-07-04 DIAGNOSIS — M542 Cervicalgia: Secondary | ICD-10-CM | POA: Diagnosis not present

## 2017-07-05 DIAGNOSIS — G43009 Migraine without aura, not intractable, without status migrainosus: Secondary | ICD-10-CM | POA: Diagnosis not present

## 2017-07-05 DIAGNOSIS — M9901 Segmental and somatic dysfunction of cervical region: Secondary | ICD-10-CM | POA: Diagnosis not present

## 2017-07-05 DIAGNOSIS — M542 Cervicalgia: Secondary | ICD-10-CM | POA: Diagnosis not present

## 2017-07-05 DIAGNOSIS — M9902 Segmental and somatic dysfunction of thoracic region: Secondary | ICD-10-CM | POA: Diagnosis not present

## 2017-07-10 DIAGNOSIS — G43009 Migraine without aura, not intractable, without status migrainosus: Secondary | ICD-10-CM | POA: Diagnosis not present

## 2017-07-10 DIAGNOSIS — M542 Cervicalgia: Secondary | ICD-10-CM | POA: Diagnosis not present

## 2017-07-10 DIAGNOSIS — M9901 Segmental and somatic dysfunction of cervical region: Secondary | ICD-10-CM | POA: Diagnosis not present

## 2017-07-10 DIAGNOSIS — M9902 Segmental and somatic dysfunction of thoracic region: Secondary | ICD-10-CM | POA: Diagnosis not present

## 2017-07-11 DIAGNOSIS — M9902 Segmental and somatic dysfunction of thoracic region: Secondary | ICD-10-CM | POA: Diagnosis not present

## 2017-07-11 DIAGNOSIS — G43009 Migraine without aura, not intractable, without status migrainosus: Secondary | ICD-10-CM | POA: Diagnosis not present

## 2017-07-11 DIAGNOSIS — M9901 Segmental and somatic dysfunction of cervical region: Secondary | ICD-10-CM | POA: Diagnosis not present

## 2017-07-11 DIAGNOSIS — M542 Cervicalgia: Secondary | ICD-10-CM | POA: Diagnosis not present

## 2017-07-12 DIAGNOSIS — M542 Cervicalgia: Secondary | ICD-10-CM | POA: Diagnosis not present

## 2017-07-12 DIAGNOSIS — M9901 Segmental and somatic dysfunction of cervical region: Secondary | ICD-10-CM | POA: Diagnosis not present

## 2017-07-12 DIAGNOSIS — M9902 Segmental and somatic dysfunction of thoracic region: Secondary | ICD-10-CM | POA: Diagnosis not present

## 2017-07-12 DIAGNOSIS — G43009 Migraine without aura, not intractable, without status migrainosus: Secondary | ICD-10-CM | POA: Diagnosis not present

## 2017-07-17 DIAGNOSIS — G43009 Migraine without aura, not intractable, without status migrainosus: Secondary | ICD-10-CM | POA: Diagnosis not present

## 2017-07-17 DIAGNOSIS — M542 Cervicalgia: Secondary | ICD-10-CM | POA: Diagnosis not present

## 2017-07-17 DIAGNOSIS — M9901 Segmental and somatic dysfunction of cervical region: Secondary | ICD-10-CM | POA: Diagnosis not present

## 2017-07-17 DIAGNOSIS — M9902 Segmental and somatic dysfunction of thoracic region: Secondary | ICD-10-CM | POA: Diagnosis not present

## 2017-07-18 DIAGNOSIS — M542 Cervicalgia: Secondary | ICD-10-CM | POA: Diagnosis not present

## 2017-07-18 DIAGNOSIS — G43009 Migraine without aura, not intractable, without status migrainosus: Secondary | ICD-10-CM | POA: Diagnosis not present

## 2017-07-18 DIAGNOSIS — M9902 Segmental and somatic dysfunction of thoracic region: Secondary | ICD-10-CM | POA: Diagnosis not present

## 2017-07-18 DIAGNOSIS — M9901 Segmental and somatic dysfunction of cervical region: Secondary | ICD-10-CM | POA: Diagnosis not present

## 2017-07-19 DIAGNOSIS — M9901 Segmental and somatic dysfunction of cervical region: Secondary | ICD-10-CM | POA: Diagnosis not present

## 2017-07-19 DIAGNOSIS — M542 Cervicalgia: Secondary | ICD-10-CM | POA: Diagnosis not present

## 2017-07-19 DIAGNOSIS — G43009 Migraine without aura, not intractable, without status migrainosus: Secondary | ICD-10-CM | POA: Diagnosis not present

## 2017-07-19 DIAGNOSIS — M9902 Segmental and somatic dysfunction of thoracic region: Secondary | ICD-10-CM | POA: Diagnosis not present

## 2017-07-24 DIAGNOSIS — M9901 Segmental and somatic dysfunction of cervical region: Secondary | ICD-10-CM | POA: Diagnosis not present

## 2017-07-24 DIAGNOSIS — M542 Cervicalgia: Secondary | ICD-10-CM | POA: Diagnosis not present

## 2017-07-24 DIAGNOSIS — G43009 Migraine without aura, not intractable, without status migrainosus: Secondary | ICD-10-CM | POA: Diagnosis not present

## 2017-07-24 DIAGNOSIS — M9902 Segmental and somatic dysfunction of thoracic region: Secondary | ICD-10-CM | POA: Diagnosis not present

## 2017-07-25 DIAGNOSIS — M542 Cervicalgia: Secondary | ICD-10-CM | POA: Diagnosis not present

## 2017-07-25 DIAGNOSIS — M9901 Segmental and somatic dysfunction of cervical region: Secondary | ICD-10-CM | POA: Diagnosis not present

## 2017-07-25 DIAGNOSIS — G43009 Migraine without aura, not intractable, without status migrainosus: Secondary | ICD-10-CM | POA: Diagnosis not present

## 2017-07-25 DIAGNOSIS — M9902 Segmental and somatic dysfunction of thoracic region: Secondary | ICD-10-CM | POA: Diagnosis not present

## 2017-07-26 DIAGNOSIS — M9901 Segmental and somatic dysfunction of cervical region: Secondary | ICD-10-CM | POA: Diagnosis not present

## 2017-07-26 DIAGNOSIS — M542 Cervicalgia: Secondary | ICD-10-CM | POA: Diagnosis not present

## 2017-07-26 DIAGNOSIS — M9902 Segmental and somatic dysfunction of thoracic region: Secondary | ICD-10-CM | POA: Diagnosis not present

## 2017-07-26 DIAGNOSIS — G43009 Migraine without aura, not intractable, without status migrainosus: Secondary | ICD-10-CM | POA: Diagnosis not present

## 2017-08-07 DIAGNOSIS — G43009 Migraine without aura, not intractable, without status migrainosus: Secondary | ICD-10-CM | POA: Diagnosis not present

## 2017-08-07 DIAGNOSIS — M9902 Segmental and somatic dysfunction of thoracic region: Secondary | ICD-10-CM | POA: Diagnosis not present

## 2017-08-07 DIAGNOSIS — M9901 Segmental and somatic dysfunction of cervical region: Secondary | ICD-10-CM | POA: Diagnosis not present

## 2017-08-07 DIAGNOSIS — M542 Cervicalgia: Secondary | ICD-10-CM | POA: Diagnosis not present

## 2017-08-09 DIAGNOSIS — G43009 Migraine without aura, not intractable, without status migrainosus: Secondary | ICD-10-CM | POA: Diagnosis not present

## 2017-08-09 DIAGNOSIS — M9902 Segmental and somatic dysfunction of thoracic region: Secondary | ICD-10-CM | POA: Diagnosis not present

## 2017-08-09 DIAGNOSIS — M542 Cervicalgia: Secondary | ICD-10-CM | POA: Diagnosis not present

## 2017-08-09 DIAGNOSIS — M9901 Segmental and somatic dysfunction of cervical region: Secondary | ICD-10-CM | POA: Diagnosis not present

## 2017-08-13 DIAGNOSIS — M9902 Segmental and somatic dysfunction of thoracic region: Secondary | ICD-10-CM | POA: Diagnosis not present

## 2017-08-13 DIAGNOSIS — G43009 Migraine without aura, not intractable, without status migrainosus: Secondary | ICD-10-CM | POA: Diagnosis not present

## 2017-08-13 DIAGNOSIS — M542 Cervicalgia: Secondary | ICD-10-CM | POA: Diagnosis not present

## 2017-08-13 DIAGNOSIS — M9901 Segmental and somatic dysfunction of cervical region: Secondary | ICD-10-CM | POA: Diagnosis not present

## 2017-08-15 DIAGNOSIS — M542 Cervicalgia: Secondary | ICD-10-CM | POA: Diagnosis not present

## 2017-08-15 DIAGNOSIS — G43009 Migraine without aura, not intractable, without status migrainosus: Secondary | ICD-10-CM | POA: Diagnosis not present

## 2017-08-15 DIAGNOSIS — M9902 Segmental and somatic dysfunction of thoracic region: Secondary | ICD-10-CM | POA: Diagnosis not present

## 2017-08-15 DIAGNOSIS — M9901 Segmental and somatic dysfunction of cervical region: Secondary | ICD-10-CM | POA: Diagnosis not present

## 2017-08-23 DIAGNOSIS — M9902 Segmental and somatic dysfunction of thoracic region: Secondary | ICD-10-CM | POA: Diagnosis not present

## 2017-08-23 DIAGNOSIS — M542 Cervicalgia: Secondary | ICD-10-CM | POA: Diagnosis not present

## 2017-08-23 DIAGNOSIS — G43009 Migraine without aura, not intractable, without status migrainosus: Secondary | ICD-10-CM | POA: Diagnosis not present

## 2017-08-23 DIAGNOSIS — M9901 Segmental and somatic dysfunction of cervical region: Secondary | ICD-10-CM | POA: Diagnosis not present

## 2017-08-27 DIAGNOSIS — M542 Cervicalgia: Secondary | ICD-10-CM | POA: Diagnosis not present

## 2017-08-27 DIAGNOSIS — M9902 Segmental and somatic dysfunction of thoracic region: Secondary | ICD-10-CM | POA: Diagnosis not present

## 2017-08-27 DIAGNOSIS — G43009 Migraine without aura, not intractable, without status migrainosus: Secondary | ICD-10-CM | POA: Diagnosis not present

## 2017-08-27 DIAGNOSIS — M9901 Segmental and somatic dysfunction of cervical region: Secondary | ICD-10-CM | POA: Diagnosis not present

## 2017-08-29 DIAGNOSIS — M9901 Segmental and somatic dysfunction of cervical region: Secondary | ICD-10-CM | POA: Diagnosis not present

## 2017-08-29 DIAGNOSIS — M542 Cervicalgia: Secondary | ICD-10-CM | POA: Diagnosis not present

## 2017-08-29 DIAGNOSIS — G43009 Migraine without aura, not intractable, without status migrainosus: Secondary | ICD-10-CM | POA: Diagnosis not present

## 2017-08-29 DIAGNOSIS — M9902 Segmental and somatic dysfunction of thoracic region: Secondary | ICD-10-CM | POA: Diagnosis not present

## 2017-09-03 DIAGNOSIS — G43009 Migraine without aura, not intractable, without status migrainosus: Secondary | ICD-10-CM | POA: Diagnosis not present

## 2017-09-03 DIAGNOSIS — M9901 Segmental and somatic dysfunction of cervical region: Secondary | ICD-10-CM | POA: Diagnosis not present

## 2017-09-03 DIAGNOSIS — M9902 Segmental and somatic dysfunction of thoracic region: Secondary | ICD-10-CM | POA: Diagnosis not present

## 2017-09-03 DIAGNOSIS — M542 Cervicalgia: Secondary | ICD-10-CM | POA: Diagnosis not present

## 2017-09-05 ENCOUNTER — Encounter: Payer: Self-pay | Admitting: Neurology

## 2017-09-05 ENCOUNTER — Encounter

## 2017-09-05 ENCOUNTER — Ambulatory Visit (INDEPENDENT_AMBULATORY_CARE_PROVIDER_SITE_OTHER): Payer: Medicare Other | Admitting: Neurology

## 2017-09-05 VITALS — BP 122/62 | HR 76 | Ht 64.0 in | Wt 161.0 lb

## 2017-09-05 DIAGNOSIS — G43109 Migraine with aura, not intractable, without status migrainosus: Secondary | ICD-10-CM | POA: Diagnosis not present

## 2017-09-05 DIAGNOSIS — M9901 Segmental and somatic dysfunction of cervical region: Secondary | ICD-10-CM | POA: Diagnosis not present

## 2017-09-05 DIAGNOSIS — M542 Cervicalgia: Secondary | ICD-10-CM | POA: Diagnosis not present

## 2017-09-05 DIAGNOSIS — M9902 Segmental and somatic dysfunction of thoracic region: Secondary | ICD-10-CM | POA: Diagnosis not present

## 2017-09-05 DIAGNOSIS — G43009 Migraine without aura, not intractable, without status migrainosus: Secondary | ICD-10-CM | POA: Diagnosis not present

## 2017-09-05 MED ORDER — TIZANIDINE HCL 4 MG PO TABS: 4.0000 mg | ORAL_TABLET | Freq: Four times a day (QID) | ORAL | 11 refills | Status: DC | PRN

## 2017-09-05 MED ORDER — VENLAFAXINE HCL ER 37.5 MG PO CP24: 37.5000 mg | ORAL_CAPSULE | Freq: Every day | ORAL | 11 refills | Status: DC

## 2017-09-05 MED ORDER — ONDANSETRON 4 MG PO TBDP: 4.0000 mg | ORAL_TABLET | Freq: Three times a day (TID) | ORAL | 6 refills | Status: DC | PRN

## 2017-09-05 MED ORDER — RIZATRIPTAN BENZOATE 10 MG PO TBDP: 10.0000 mg | ORAL_TABLET | ORAL | 11 refills | Status: DC | PRN

## 2017-09-05 MED ORDER — FREMANEZUMAB-VFRM 225 MG/1.5ML ~~LOC~~ SOSY: 225.0000 mg | PREFILLED_SYRINGE | SUBCUTANEOUS | 11 refills | Status: DC

## 2017-09-05 NOTE — Progress Notes (Signed)
PATIENT: Elizabeth Kennedy DOB: 08-Aug-1970  Chief Complaint  Patient presents with  . Migraine    Reports migraines for 20+ years. She was previously treated by Dr. Catalina Gravel who retired.  She stopped taking all her daily preventive medications in May 2019.  She is now experiencing 10-15 headache days per month.  She seldom uses medications to treat her pain.  She will occasionally use Tylenol and/or Imitrex.  She has also started going to a chiropractor.  Tania Ade, DO (no PCP)     HISTORICAL  Elizabeth Kennedy is a 47 year old female, seen in request by her primary care physician Dr. Rogue Bussing, [place], for evaluation of chronic migraine headaches, initial evaluation was on September 05, 2017.  I reviewed and summarized the referring note, she had a history of migraine headache, anxiety, fibromyalgia, is on disability due to chronic migraine headaches, she also reported history of PTSD,  She had a history of migraine for 20 years, previously was under the care of of body neurologist Dr. Wallace Going for 14 years, since 2017, she was under the care of Dr. Catalina Gravel, most recent office visit was in March 2018,  I also reviewed and summarized her most recent office visit with headache and the neck pain clinic Dr. Catalina Gravel on May 01, 2016, she was given lidocaine nasal spray, Imitrex for acute rescue therapy,  List of previous prophylactic medications she has tried including Neurontin, doxepin, amitriptyline, Lamictal, nortriptyline, Tylenol, Effexor, Topamax, questionable benefit she complains of mental slowing, word finding difficulties verapamil, Keppra,  Laboratory evaluations in April 2017, mild anemia with hemoglobin of 11.7, normal CMP, creatinine of 0.78,  MRI of the brain without contrast in November 2016  CT head without contrast November 2017 was normal,  EKG previously showed normal sinus rhythm  MRA of the brain without contrast February 2016 showed no  large vessel disease 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,  Since May 2019, she has been off all the preventive medications, she noticed no significant difference in her migraine headaches,I reviewed her headache diary, in June 2019, she had 16 headache days, July 2019, 11 headache days, her typical migraine started with blurry vision sometimes tunnel vision lasting for 30 minutes, gradually building up of moderate to severe pounding headache with associated light noise sensitivity, nauseous, for up, movement made her headache worse, sleep helps her migraine, she reported her intense migraine headaches 20 out of 10 pain lasting for 1 day, then gradually recover," I am either going into a migraine headaches or coming out of her migraine headaches, and rarely has headache free days".  For abortive treatment, she only tried Imitrex, not any other triptans, she tends to take Imitrex one her headache becomes unbearable, she only used two Imitrex in the past months, hour after Imitrex, she did not notice some improvement of her headaches, she usually go to sleep after she takes Imitrex.  She denies significant side effect.  REVIEW OF SYSTEMS: Full 14 system review of systems performed and notable only for memory loss, headaches, numbness, weakness, decreased energy, anemia, feeling hot, increased thirst, joints, achy muscles, ringing the ears,  ALLERGIES: Allergies  Allergen Reactions  . Gluten Meal   . Morphine And Related Nausea And Vomiting  . Tape Hives    Use paper tape    HOME MEDICATIONS: Current Outpatient Medications  Medication Sig Dispense Refill  . acetaminophen (TYLENOL) 500 MG tablet Take 500 mg every 6 (six)  hours as needed by mouth.    . Ferrous Sulfate (IRON) 325 (65 Fe) MG TABS Take 1 tablet daily by mouth.    . Probiotic Product (PROBIOTIC DAILY) CAPS Take 1 capsule daily by mouth.    . promethazine (PHENERGAN) 25 MG tablet Take 25 mg every 6 (six) hours  as needed by mouth for nausea or vomiting.    . SUMAtriptan (IMITREX) 100 MG tablet Take 100 mg every 2 (two) hours as needed by mouth for migraine. May repeat in 2 hours if headache persists or recurs.     No current facility-administered medications for this visit.     PAST MEDICAL HISTORY: Past Medical History:  Diagnosis Date  . Arthritis   . Fibromyalgia    per pt has left hand mild numbness/ weakness and left foot due to fibromyalgia  . History of esophageal dilatation    per pt x5  last one 2012  approx.  Marland Kitchen History of exercise stress test 03-01-2010   dr hochrein   no evidence ischemia/  also Stress Echo test done-- with stress there was a thickening and contractility in all segments, there is decrease in cavity size in all views; this is a normal stress echo study;  normal LV global function and wall motion, ef 555%  . IBS (irritable bowel syndrome)    diet controlled. no meds  . Iron deficiency anemia   . Menorrhagia   . Migraines   . Seasonal allergies   . Wears glasses     PAST SURGICAL HISTORY: Past Surgical History:  Procedure Laterality Date  . DILATATION & CURETTAGE/HYSTEROSCOPY WITH MYOSURE N/A 06/21/2015   Procedure: DILATATION & CURETTAGE/HYSTEROSCOPY WITH MYOSURE;  Surgeon: [name], DO;  Location: [facility];  Service: Gynecology;  Laterality: N/A;  . DILITATION & CURRETTAGE/HYSTROSCOPY WITH NOVASURE ABLATION N/A 01/10/2017   Procedure: DILATATION & CURETTAGE/HYSTEROSCOPY WITH NOVASURE ABLATION;  Surgeon: [name], DO;  Location: [facility];  Service: Gynecology;  Laterality: N/A;  . LAPAROSCOPIC CHOLECYSTECTOMY  2008  . MASS EXCISION Right 11/22/2012   Procedure: EXCISION RIGHT GROIN MASS ;  Surgeon: Harl Bowie, MD;  Location: [facility];  Service: General;  Laterality: Right;  . TUBAL LIGATION Bilateral 1995   PPTL  . UPPER GI ENDOSCOPY  x5  last one 2012  approx.   w/ dilations    FAMILY HISTORY: Family History  Problem  Relation Age of Onset  . Diabetes Father   . Kidney disease Father   . Aneurysm Sister     SOCIAL HISTORY: Social History   Socioeconomic History  . Marital status: Single    Spouse name: Not on file  . Number of children: 4  . Years of education: college  . Highest education level: Associate degree: occupational, Hotel manager, or vocational program  Occupational History  . Occupation: Disabled by migraines  Social Needs  . Financial resource strain: Not on file  . Food insecurity:    Worry: Not on file    Inability: Not on file  . Transportation needs:    Medical: Not on file    Non-medical: Not on file  Tobacco Use  . Smoking status: Never Smoker  . Smokeless tobacco: Never Used  Substance and Sexual Activity  . Alcohol use: No    Comment: no use since 2012  . Drug use: No  . Sexual activity: Yes    Birth control/protection: Surgical  Lifestyle  . Physical activity:    Days per week: Not on file  Minutes per session: Not on file  . Stress: Not on file  Relationships  . Social connections:    Talks on phone: Not on file    Gets together: Not on file    Attends religious service: Not on file    Active member of club or organization: Not on file    Attends meetings of clubs or organizations: Not on file    Relationship status: Not on file  . Intimate partner violence:    Fear of current or ex partner: Not on file    Emotionally abused: Not on file    Physically abused: Not on file    Forced sexual activity: Not on file  Other Topics Concern  . Not on file  Social History Narrative   Lives at home alone.   Right-handed.   2 cups coffee per day.     PHYSICAL EXAM   Vitals:   09/05/17 0924  BP: 122/62  Pulse: 76  Weight: 161 lb (73 kg)  Height: 5\' 4"  (1.626 m)    Not recorded      Body mass index is 27.64 kg/m.  PHYSICAL EXAMNIATION:  Gen: NAD, conversant, well nourised, obese, well groomed                     Cardiovascular: Regular rate  rhythm, no peripheral edema, warm, nontender. Eyes: Conjunctivae clear without exudates or hemorrhage Neck: Supple, no carotid bruits. Pulmonary: Clear to auscultation bilaterally   NEUROLOGICAL EXAM:  MENTAL STATUS: Speech:    Speech is normal; fluent and spontaneous with normal comprehension.  Cognition:     Orientation to time, place and person     Normal recent and remote memory     Normal Attention span and concentration     Normal Language, naming, repeating,spontaneous speech     Fund of knowledge   CRANIAL NERVES: CN II: Visual fields are full to confrontation. Fundoscopic exam is normal with sharp discs and no vascular changes. Pupils are round equal and briskly reactive to light. CN III, IV, VI: extraocular movement are normal. No ptosis. CN V: Facial sensation is intact to pinprick in all 3 divisions bilaterally. Corneal responses are intact.  CN VII: Face is symmetric with normal eye closure and smile. CN VIII: Hearing is normal to rubbing fingers CN IX, X: Palate elevates symmetrically. Phonation is normal. CN XI: Head turning and shoulder shrug are intact CN XII: Tongue is midline with normal movements and no atrophy.  MOTOR: There is no pronator drift of out-stretched arms. Muscle bulk and tone are normal. Muscle strength is normal.  REFLEXES: Reflexes are 2+ and symmetric at the biceps, triceps, knees, and ankles. Plantar responses are flexor.  SENSORY: Intact to light touch, pinprick, positional sensation and vibratory sensation are intact in fingers and toes.  COORDINATION: Rapid alternating movements and fine finger movements are intact. There is no dysmetria on finger-to-nose and heel-knee-shin.    GAIT/STANCE: Posture is normal. Gait is steady with normal steps, base, arm swing, and turning. Heel and toe walking are normal. Tandem gait is normal.  Romberg is absent.   DIAGNOSTIC DATA (LABS, IMAGING, TESTING) - I reviewed patient records, labs, notes,  testing and imaging myself where available.   ASSESSMENT AND PLAN  Elizabeth Kennedy is a 47 y.o. female    Chronic migraine with aura  Was seen by different neurologist in the past, with suboptimal control of her chronic migraine headaches, there is also history of PTSD, is  currently under psychotherapy, chiropractor,  Will try preventive medication Effexor ER 37.5 mg daily as preventive medications  Suboptimal response to Imitrex, will try Maxalt 10 mg sublingual as needed, may combine it together with Zofran, muscle relaxant tizanidine, and Aleve  Add on Ajovy to 25 mg subcutaneous injection every month as migraine prevention  Next follow-up follow-up with headache specialist Dr. Jaynee Eagles in 3 months   Marcial Pacas, M.D. Ph.D.  [facility] [address], [place], [place] [address] Ph: [phone] Fax: [phone]  CC: [name], DO

## 2017-09-05 NOTE — Patient Instructions (Signed)
Try to take abortive treatment for her migraine headache at the beginning of the headaches,  You may combine  Heating pad Maxalt Zofran as needed for nausea Aleve 1 to 2 tablets Tizanidine as muscle relaxant  Preventive medications: Effexor 37.5 mg every day after meal Ajovy 225 mg subcutaneous injection every month

## 2017-09-10 DIAGNOSIS — M9901 Segmental and somatic dysfunction of cervical region: Secondary | ICD-10-CM | POA: Diagnosis not present

## 2017-09-10 DIAGNOSIS — G43009 Migraine without aura, not intractable, without status migrainosus: Secondary | ICD-10-CM | POA: Diagnosis not present

## 2017-09-10 DIAGNOSIS — M542 Cervicalgia: Secondary | ICD-10-CM | POA: Diagnosis not present

## 2017-09-10 DIAGNOSIS — M9902 Segmental and somatic dysfunction of thoracic region: Secondary | ICD-10-CM | POA: Diagnosis not present

## 2017-09-12 DIAGNOSIS — M542 Cervicalgia: Secondary | ICD-10-CM | POA: Diagnosis not present

## 2017-09-12 DIAGNOSIS — M9901 Segmental and somatic dysfunction of cervical region: Secondary | ICD-10-CM | POA: Diagnosis not present

## 2017-09-12 DIAGNOSIS — G43009 Migraine without aura, not intractable, without status migrainosus: Secondary | ICD-10-CM | POA: Diagnosis not present

## 2017-09-12 DIAGNOSIS — M9902 Segmental and somatic dysfunction of thoracic region: Secondary | ICD-10-CM | POA: Diagnosis not present

## 2017-09-17 DIAGNOSIS — G43009 Migraine without aura, not intractable, without status migrainosus: Secondary | ICD-10-CM | POA: Diagnosis not present

## 2017-09-17 DIAGNOSIS — M9902 Segmental and somatic dysfunction of thoracic region: Secondary | ICD-10-CM | POA: Diagnosis not present

## 2017-09-17 DIAGNOSIS — M9901 Segmental and somatic dysfunction of cervical region: Secondary | ICD-10-CM | POA: Diagnosis not present

## 2017-09-17 DIAGNOSIS — M542 Cervicalgia: Secondary | ICD-10-CM | POA: Diagnosis not present

## 2017-10-30 DIAGNOSIS — R1031 Right lower quadrant pain: Secondary | ICD-10-CM | POA: Diagnosis not present

## 2017-10-30 DIAGNOSIS — N920 Excessive and frequent menstruation with regular cycle: Secondary | ICD-10-CM | POA: Diagnosis not present

## 2017-12-10 ENCOUNTER — Ambulatory Visit (INDEPENDENT_AMBULATORY_CARE_PROVIDER_SITE_OTHER): Payer: Medicare Other | Admitting: Neurology

## 2017-12-10 ENCOUNTER — Encounter: Payer: Self-pay | Admitting: Neurology

## 2017-12-10 VITALS — BP 127/74 | HR 71 | Ht 64.5 in | Wt 156.0 lb

## 2017-12-10 DIAGNOSIS — G43101 Migraine with aura, not intractable, with status migrainosus: Secondary | ICD-10-CM | POA: Diagnosis not present

## 2017-12-10 DIAGNOSIS — G43711 Chronic migraine without aura, intractable, with status migrainosus: Secondary | ICD-10-CM

## 2017-12-10 MED ORDER — VENLAFAXINE HCL ER 37.5 MG PO CP24: 37.5000 mg | ORAL_CAPSULE | Freq: Every day | ORAL | 11 refills | Status: DC

## 2017-12-10 MED ORDER — ERENUMAB-AOOE 140 MG/ML ~~LOC~~ SOAJ: 140.0000 mg | SUBCUTANEOUS | 0 refills | Status: DC

## 2017-12-10 NOTE — Patient Instructions (Signed)
Start Botox Start Emgality    Galcanezumab: Patient drug information [employer] here. Copyright [phone] [employer] All rights reserved. (For additional information see "Galcanezumab: Drug information") Brand Names: [place]  Emgality;  Emgality (300 MG Dose)  What is this drug used for?   It is used to prevent migraine headaches.   It is used to treat cluster headaches.  What do I need to tell my doctor BEFORE I take this drug?   If you have an allergy to this drug or any part of this drug.   If you are allergic to any drugs like this one, any other drugs, foods, or other substances. Tell your doctor about the allergy and what signs you had, like rash; hives; itching; shortness of breath; wheezing; cough; swelling of face, lips, tongue, or throat; or any other signs.   This drug may interact with other drugs or health problems.   Tell your doctor and pharmacist about all of your drugs (prescription or OTC, natural products, vitamins) and health problems. You must check to make sure that it is safe for you to take this drug with all of your drugs and health problems. Do not start, stop, or change the dose of any drug without checking with your doctor.  What are some things I need to know or do while I take this drug?   Tell all of your health care providers that you take this drug. This includes your doctors, nurses, pharmacists, and dentists.   Do not share pen or cartridge devices with another person even if the needle has been changed. Sharing these devices may pass infections from one person to another. This includes infections you may not know you have.   Tell your doctor if you are pregnant, plan on getting pregnant, or are breast-feeding. You will need to talk about the benefits and risks to you and the baby.  What are some side effects that I need to call my doctor about right away?   WARNING/CAUTION: Even though it may be rare, some people may have very bad and  sometimes deadly side effects when taking a drug. Tell your doctor or get medical help right away if you have any of the following signs or symptoms that may be related to a very bad side effect:   Signs of an allergic reaction, like rash; hives; itching; red, swollen, blistered, or peeling skin with or without fever; wheezing; tightness in the chest or throat; trouble breathing, swallowing, or talking; unusual hoarseness; or swelling of the mouth, face, lips, tongue, or throat.  What are some other side effects of this drug?   All drugs may cause side effects. However, many people have no side effects or only have minor side effects. Call your doctor or get medical help if any of these side effects or any other side effects bother you or do not go away:   Irritation where the shot is given.   These are not all of the side effects that may occur. If you have questions about side effects, call your doctor. Call your doctor for medical advice about side effects.   You may report side effects to your national health agency.  How is this drug best taken?   Use this drug as ordered by your doctor. Read all information given to you. Follow all instructions closely.   It is given as a shot into the fatty part of the skin in the upper arm, thigh, buttocks, or stomach area.  If you will be giving yourself the shot, your doctor or nurse will teach you how to give the shot.   Wash your hands before use.   If stored in a refrigerator, let this drug come to room temperature before using it. Leave it at room temperature for at least 30 minutes. Do not heat this drug.   Do not shake.   Do not give into skin that is irritated, bruised, red, infected, or scarred.   Do not give into skin within 2 inches of the belly button.   If your dose is more than 1 injection, you may give it in the same body part. Make sure you do not give injections in the same spot you used for the other injections.   Do not rub the site  where you give the shot.   Do not use if the solution is cloudy, leaking, or has particles.   This drug is colorless to slightly yellow or slightly brown. Do not use if the solution changes color.   Move the site where you give the shot with each shot.   If you drop this drug on a hard surface, do not use it.   Each prefilled pen or syringe is for one use only.   Throw away needles in a needle/sharp disposal box. Do not reuse needles or other items. When the box is full, follow all local rules for getting rid of it. Talk with a doctor or pharmacist if you have any questions.  What do I do if I miss a dose?   Take a missed dose as soon as you think about it.   After taking a missed dose, start a new schedule based on when the dose is taken.  How do I store and/or throw out this drug?   Store in a refrigerator. Do not freeze.   Store in the carton to protect from light.   If needed, you may store at room temperature for up to 7 days. Write down the date you take this drug out of the refrigerator. If stored at room temperature and not used within 7 days, throw this drug away.   Do not put this drug back in the refrigerator after it has been stored at room temperature.   Keep all drugs in a safe place. Keep all drugs out of the reach of children and pets.   Throw away unused or expired drugs. Do not flush down a toilet or pour down a drain unless you are told to do so. Check with your pharmacist if you have questions about the best way to throw out drugs. There may be drug take-back programs in your area.  General drug facts   If your symptoms or health problems do not get better or if they become worse, call your doctor.   Do not share your drugs with others and do not take anyone else's drugs.   Keep a list of all your drugs (prescription, natural products, vitamins, OTC) with you. Give this list to your doctor.   Talk with the doctor before starting any new drug, including prescription or OTC,  natural products, or vitamins.   Some drugs may have another patient information leaflet. If you have any questions about this drug, please talk with your doctor, nurse, pharmacist, or other health care provider.   If you think there has been an overdose, call your poison control center or get medical care right away. Be ready to tell or show what  was taken, how much, and when it happened.

## 2017-12-10 NOTE — Progress Notes (Signed)
[facility]    Provider:  Dr Jaynee Eagles Referring Provider: [name], DO Primary Care Physician:  [name], DO  CC:  Migraines  HPI:  Elizabeth Kennedy is a 47 y.o. female here as requested by [name], DO for migraines.  Patient is new to me today that she was seen by my colleague Dr. [name] in July of this year and prior to that Dr. [name] who retired.  Past medical history migraines, anxiety, fibromyalgia, on disability due to chronic migraines history of PTSD. Her headaches may start with blurry vision sometimes tunnel vision lasting for 30 minutes but has had migraines without aura as well.  They are unilateral on the right. Severe pounding headache with associated light and noise sensitivity, nausea, movement makes it worse, sleep helps, she has intense migraine headaches with severe pain lasting for greater than 24 hours.  She says she is either going into a migraine headache, having a migraine headache or coming out of a migraine headache rarely has headache free days.  She is tried and failed multiple medications. She has failed multiple medications. She has had multiple side effects on medications and over the last 20 years she has been on multiple medications. She has daily headaches and at least 15 migraine days a month. No medication overuse. Does not treat migraines with acute medication more than 2-3x a week. Sleeping well.   Reviewed notes, labs and imaging from outside physicians, which showed:  Reviewed records from Dr. [name] my colleague who previously saw patient.  Patient stopped taking only daily preventative medications in May 2019.  When she was seen in July she was experiencing 15 headache days a month.  No medication overuse.  She started going to a chiropractor.  Prior to that she had a history of migraine headaches for many years.  She has a history of migraines for 20 years previously under the care of Dr. Melton Alar since 2018 at and  neurologist Dr. Suanne Marker for 14 years prior to that.  Her most recent visit with Dr. [name] she was given a lidocaine nose spray and Imitrex for acute management.  List of prior medications include Neurontin, doxepin, amitriptyline, Lamictal, nortriptyline, Tylenol, Effexor, Topamax, verapamil and Keppra.  And per notes, MRI of the brain without contrast in November 2016 and MRI of the brain without contrast 5-year 2016 were unremarkable.   At Dr. Rhea Belton appointment she was started on Effexor.   Review of Systems: Patient complains of symptoms per HPI as well as the following symptoms: migraine. Pertinent negatives and positives per HPI. All others negative.   Social History   Socioeconomic History  . Marital status: Single    Spouse name: Not on file  . Number of children: 4  . Years of education: college  . Highest education level: Associate degree: occupational, Hotel manager, or vocational program  Occupational History  . Occupation: Disabled by migraines  Social Needs  . Financial resource strain: Not on file  . Food insecurity:    Worry: Not on file    Inability: Not on file  . Transportation needs:    Medical: Not on file    Non-medical: Not on file  Tobacco Use  . Smoking status: Never Smoker  . Smokeless tobacco: Never Used  Substance and Sexual Activity  . Alcohol use: No    Comment: no use since 2012  . Drug use: No  . Sexual activity: Yes    Birth control/protection: Surgical  Lifestyle  . Physical  activity:    Days per week: Not on file    Minutes per session: Not on file  . Stress: Not on file  Relationships  . Social connections:    Talks on phone: Not on file    Gets together: Not on file    Attends religious service: Not on file    Active member of club or organization: Not on file    Attends meetings of clubs or organizations: Not on file    Relationship status: Not on file  . Intimate partner violence:    Fear of current or ex partner: Not on file      Emotionally abused: Not on file    Physically abused: Not on file    Forced sexual activity: Not on file  Other Topics Concern  . Not on file  Social History Narrative   Lives at home alone.   Right-handed.   2 cups coffee per day.    Family History  Problem Relation Age of Onset  . Diabetes Father   . Kidney disease Father   . Aneurysm Sister     Past Medical History:  Diagnosis Date  . Arthritis   . Fibromyalgia    per pt has left hand mild numbness/ weakness and left foot due to fibromyalgia  . History of esophageal dilatation    per pt x5  last one 2012  approx.  Marland Kitchen History of exercise stress test 03-01-2010   dr hochrein   no evidence ischemia/  also Stress Echo test done-- with stress there was a thickening and contractility in all segments, there is decrease in cavity size in all views; this is a normal stress echo study;  normal LV global function and wall motion, ef 555%  . IBS (irritable bowel syndrome)    diet controlled. no meds  . Iron deficiency anemia   . Menorrhagia   . Migraines   . Seasonal allergies   . Wears glasses     Past Surgical History:  Procedure Laterality Date  . DILATATION & CURETTAGE/HYSTEROSCOPY WITH MYOSURE N/A 06/21/2015   Procedure: DILATATION & CURETTAGE/HYSTEROSCOPY WITH MYOSURE;  Surgeon: [name], DO;  Location: [facility];  Service: Gynecology;  Laterality: N/A;  . DILITATION & CURRETTAGE/HYSTROSCOPY WITH NOVASURE ABLATION N/A 01/10/2017   Procedure: DILATATION & CURETTAGE/HYSTEROSCOPY WITH NOVASURE ABLATION;  Surgeon: [name], DO;  Location: [facility];  Service: Gynecology;  Laterality: N/A;  . LAPAROSCOPIC CHOLECYSTECTOMY  2008  . MASS EXCISION Right 11/22/2012   Procedure: EXCISION RIGHT GROIN MASS ;  Surgeon: Harl Bowie, MD;  Location: [facility];  Service: General;  Laterality: Right;  . TUBAL LIGATION Bilateral 1995   PPTL  . UPPER GI ENDOSCOPY  x5  last one 2012  approx.   w/ dilations     Current Outpatient Medications  Medication Sig Dispense Refill  . acetaminophen (TYLENOL) 500 MG tablet Take 500 mg every 6 (six) hours as needed by mouth.    . Ferrous Sulfate (IRON) 325 (65 Fe) MG TABS Take 1 tablet daily by mouth.    . Probiotic Product (PROBIOTIC DAILY) CAPS Take 1 capsule daily by mouth.    . promethazine (PHENERGAN) 25 MG tablet Take 25 mg every 6 (six) hours as needed by mouth for nausea or vomiting.    . rizatriptan (MAXALT-MLT) 10 MG disintegrating tablet Take 1 tablet (10 mg total) by mouth as needed. May repeat in 2 hours if needed 12 tablet 11  . tiZANidine (ZANAFLEX) 4 MG  tablet Take 1 tablet (4 mg total) by mouth every 6 (six) hours as needed for muscle spasms. 30 tablet 11  . Erenumab-aooe (AIMOVIG) 140 MG/ML SOAJ Inject 140 mg into the skin every 30 (thirty) days. 6 pen 0   No current facility-administered medications for this visit.     Allergies as of 12/10/2017 - Review Complete 12/10/2017  Allergen Reaction Noted  . Gluten meal  01/01/2017  . Morphine and related Nausea And Vomiting 11/12/2012  . Tape Hives 11/15/2012    Vitals: BP 127/74   Pulse 71   Ht 5' 4.5" (1.638 m)   Wt 156 lb (70.8 kg)   BMI 26.36 kg/m  Last Weight:  Wt Readings from Last 1 Encounters:  12/10/17 156 lb (70.8 kg)   Last Height:   Ht Readings from Last 1 Encounters:  12/10/17 5' 4.5" (1.638 m)   Physical exam: Exam: Gen: NAD, conversant, well nourised, well groomed                     CV: RRR, no MRG. No Carotid Bruits. No peripheral edema, warm, nontender Eyes: Conjunctivae clear without exudates or hemorrhage  Neuro: Detailed Neurologic Exam  Speech:    Speech is normal; fluent and spontaneous with normal comprehension.  Cognition:    The patient is oriented to person, place, and time;     recent and remote memory intact;     language fluent;     normal attention, concentration,     fund of knowledge Cranial Nerves:    The pupils are equal, round,  and reactive to light. The fundi are normal and spontaneous venous pulsations are present. Visual fields are full to finger confrontation. Extraocular movements are intact. Trigeminal sensation is intact and the muscles of mastication are normal. The face is symmetric. The palate elevates in the midline. Hearing intact. Voice is normal. Shoulder shrug is normal. The tongue has normal motion without fasciculations.   Coordination:    Normal finger to nose and heel to shin. Normal rapid alternating movements.   Gait:    Heel-toe and tandem gait are normal.   Motor Observation:    No asymmetry, no atrophy, and no involuntary movements noted. Tone:    Normal muscle tone.    Posture:    Posture is normal. normal erect    Strength:    Strength is V/V in the upper and lower limbs.      Sensation: intact to LT     Reflex Exam:  DTR's:    Deep tendon reflexes in the upper and lower extremities are normal bilaterally.   Toes:    The toes are downgoing bilaterally.   Clonus:    Clonus is absent.       Assessment/Plan:  47 year old with chronic intractable migraines without aura. Failed multiple classes of medications. No medication overuse.  - She would be an excellent candidate for Botox - Also discussed Emgality and provided samples however asked patient not to proceed until we have given botox 3 injections to see for efficacy she can keep the samples in her refridgerator. - she has a Restaurant manager, fast food, performs biofeedback and acupuncture, sleep well and eats well, meditation. impressive  Discussed: To prevent or relieve headaches, try the following: Cool Compress. Lie down and place a cool compress on your head.  Avoid headache triggers. If certain foods or odors seem to have triggered your migraines in the past, avoid them. A headache diary might help you identify  triggers.  Include physical activity in your daily routine. Try a daily walk or other moderate aerobic exercise.  Manage  stress. Find healthy ways to cope with the stressors, such as delegating tasks on your to-do list.  Practice relaxation techniques. Try deep breathing, yoga, massage and visualization.  Eat regularly. Eating regularly scheduled meals and maintaining a healthy diet might help prevent headaches. Also, drink plenty of fluids.  Follow a regular sleep schedule. Sleep deprivation might contribute to headaches Consider biofeedback. With this mind-body technique, you learn to control certain bodily functions - such as muscle tension, heart rate and blood pressure - to prevent headaches or reduce headache pain.    Proceed to emergency room if you experience new or worsening symptoms or symptoms do not resolve, if you have new neurologic symptoms or if headache is severe, or for any concerning symptom.   Provided education and documentation from American headache Society toolbox including articles on: chronic migraine medication overuse headache, chronic migraines, prevention of migraines, behavioral and other nonpharmacologic treatments for headache.  Cc: [name], DO  Sarina Ill, MD  [facility] [address] [place], [place] [address]  Phone [phone] Fax [phone]  A total of 40 minutes was spent face-to-face with this patient. Over half this time was spent on counseling patient on the  1. Chronic migraine without aura, with intractable migraine, so stated, with status migrainosus   2. Migraine with aura and with status migrainosus, not intractable     diagnosis and different diagnostic and therapeutic options, counseling and coordination of care, risks ans benefits of management, compliance, or risk factor reduction and education.

## 2017-12-21 ENCOUNTER — Ambulatory Visit: Payer: Medicare Other | Admitting: Neurology

## 2017-12-21 DIAGNOSIS — G43711 Chronic migraine without aura, intractable, with status migrainosus: Secondary | ICD-10-CM | POA: Diagnosis not present

## 2017-12-21 NOTE — Progress Notes (Signed)
Consent Form Botulism Toxin Injection For Chronic Migraine  12/21/2017: This is patient's first botox. Her baseline: She has daily headaches and at least 15 migraine days a month at baseline. +OO, +LS, +M/T  Reviewed orally with patient, additionally signature is on file:  Botulism toxin has been approved by the Federal drug administration for treatment of chronic migraine. Botulism toxin does not cure chronic migraine and it may not be effective in some patients.  The administration of botulism toxin is accomplished by injecting a small amount of toxin into the muscles of the neck and head. Dosage must be titrated for each individual. Any benefits resulting from botulism toxin tend to wear off after 3 months with a repeat injection required if benefit is to be maintained. Injections are usually done every 3-4 months with maximum effect peak achieved by about 2 or 3 weeks. Botulism toxin is expensive and you should be sure of what costs you will incur resulting from the injection.  The side effects of botulism toxin use for chronic migraine may include:   -Transient, and usually mild, facial weakness with facial injections  -Transient, and usually mild, head or neck weakness with head/neck injections  -Reduction or loss of forehead facial animation due to forehead muscle weakness  -Eyelid drooping  -Dry eye  -Pain at the site of injection or bruising at the site of injection  -Double vision  -Potential unknown long term risks  Contraindications: You should not have Botox if you are pregnant, nursing, allergic to albumin, have an infection, skin condition, or muscle weakness at the site of the injection, or have myasthenia gravis, Lambert-Eaton syndrome, or ALS.  It is also possible that as with any injection, there may be an allergic reaction or no effect from the medication. Reduced effectiveness after repeated injections is sometimes seen and rarely infection at the injection site may  occur. All care will be taken to prevent these side effects. If therapy is given over a long time, atrophy and wasting in the muscle injected may occur. Occasionally the patient's become refractory to treatment because they develop antibodies to the toxin. In this event, therapy needs to be modified.  I have read the above information and consent to the administration of botulism toxin.    BOTOX PROCEDURE NOTE FOR MIGRAINE HEADACHE    Contraindications and precautions discussed with patient(above). Aseptic procedure was observed and patient tolerated procedure. Procedure performed by Dr. Georgia Dom  The condition has existed for more than 6 months, and pt does not have a diagnosis of ALS, Myasthenia Gravis or Lambert-Eaton Syndrome.  Risks and benefits of injections discussed and pt agrees to proceed with the procedure.  Written consent obtained  These injections are medically necessary. Pt  receives good benefits from these injections. These injections do not cause sedations or hallucinations which the oral therapies may cause.  Description of procedure:  The patient was placed in a sitting position. The standard protocol was used for Botox as follows, with 5 units of Botox injected at each site:   -Procerus muscle, midline injection  -Corrugator muscle, bilateral injection  -Frontalis muscle, bilateral injection, with 2 sites each side, medial injection was performed in the upper one third of the frontalis muscle, in the region vertical from the medial inferior edge of the superior orbital rim. The lateral injection was again in the upper one third of the forehead vertically above the lateral limbus of the cornea, 1.5 cm lateral to the medial injection site.  -  Levator Scapulae: 5 units bilaterally  -Temporalis muscle injection, 5 sites, bilaterally. The first injection was 3 cm above the tragus of the ear, second injection site was 1.5 cm to 3 cm up from the first injection site in  line with the tragus of the ear. The third injection site was 1.5-3 cm forward between the first 2 injection sites. The fourth injection site was 1.5 cm posterior to the second injection site. 5th site laterally in the temporalis  muscleat the level of the outer canthus.  - Patient feels her clenching is a trigger for headaches. +5 units masseter bilaterally   - Patient feels the migraines are centered around the eyes +5 units bilaterally at the outer canthus in the orbicularis occuli  -Occipitalis muscle injection, 3 sites, bilaterally. The first injection was done one half way between the occipital protuberance and the tip of the mastoid process behind the ear. The second injection site was done lateral and superior to the first, 1 fingerbreadth from the first injection. The third injection site was 1 fingerbreadth superiorly and medially from the first injection site.  -Cervical paraspinal muscle injection, 2 sites, bilateral knee first injection site was 1 cm from the midline of the cervical spine, 3 cm inferior to the lower border of the occipital protuberance. The second injection site was 1.5 cm superiorly and laterally to the first injection site.  -Trapezius muscle injection was performed at 3 sites, bilaterally. The first injection site was in the upper trapezius muscle halfway between the inflection point of the neck, and the acromion. The second injection site was one half way between the acromion and the first injection site. The third injection was done between the first injection site and the inflection point of the neck.   Will return for repeat injection in 3 months.   A 200 unit sof Botox was used, any Botox not injected was wasted. The patient tolerated the procedure well, there were no complications of the above procedure.

## 2017-12-21 NOTE — Progress Notes (Signed)
Botox- 100 units x 2 vials Lot: [id number] Expiration: 05/2020 NDC: 0023-1145-01  Bacteriostatic 0.9% Sodium Chloride- 4mL total Lot: [id number] Expiration: 11/14/2018 NDC: 0409-1966-02  Dx: G43.711 B/B   

## 2018-03-27 ENCOUNTER — Telehealth: Payer: Self-pay | Admitting: Neurology

## 2018-03-27 ENCOUNTER — Ambulatory Visit (INDEPENDENT_AMBULATORY_CARE_PROVIDER_SITE_OTHER): Payer: Medicare Other | Admitting: Neurology

## 2018-03-27 DIAGNOSIS — G4700 Insomnia, unspecified: Secondary | ICD-10-CM

## 2018-03-27 DIAGNOSIS — R0681 Apnea, not elsewhere classified: Secondary | ICD-10-CM

## 2018-03-27 DIAGNOSIS — G43711 Chronic migraine without aura, intractable, with status migrainosus: Secondary | ICD-10-CM | POA: Diagnosis not present

## 2018-03-27 DIAGNOSIS — R51 Headache: Secondary | ICD-10-CM | POA: Diagnosis not present

## 2018-03-27 DIAGNOSIS — R519 Headache, unspecified: Secondary | ICD-10-CM

## 2018-03-27 DIAGNOSIS — M542 Cervicalgia: Secondary | ICD-10-CM

## 2018-03-27 NOTE — Progress Notes (Signed)
[facility]    Provider:  Dr Jaynee Eagles Referring Provider: [name], DO Primary Care Physician:  [name], DO  CC:  Migraines  Interval history 03/27/2018: She did exceptionally well the first month after botox but the 2nd month she had a 10-day migraines, the weather has really been affecting her. In January month she had daily headaches, she had 15 migraine days. She also had leg cramps. This is her 4th aimovig. She has neck pain as well. She is not sleeping well, hasn;t been able to sleep. Would stop the Aimovig.   - has morning headaches most days of the week, she wakes with them, feels short of breath at night and wakes up feeling like she can;t breath. Sleep eval. Refractory headaches has tried multiple meds, cgrp, botox. She is excessively fatigued.   HPI:  Elizabeth Kennedy is a 48 y.o. female here as requested by [name], DO for migraines.  Patient is new to me today that she was seen by my colleague Dr. [name] in July of this year and prior to that Dr. [name] who retired.  Past medical history migraines, anxiety, fibromyalgia, on disability due to chronic migraines history of PTSD. Her headaches may start with blurry vision sometimes tunnel vision lasting for 30 minutes but has had migraines without aura as well.  They are unilateral on the right. Severe pounding headache with associated light and noise sensitivity, nausea, movement makes it worse, sleep helps, she has intense migraine headaches with severe pain lasting for greater than 24 hours.  She says she is either going into a migraine headache, having a migraine headache or coming out of a migraine headache rarely has headache free days.  She is tried and failed multiple medications. She has failed multiple medications. She has had multiple side effects on medications and over the last 20 years she has been on multiple medications. She has daily headaches and at least 15 migraine days a month.  No medication overuse. Does not treat migraines with acute medication more than 2-3x a week. Sleeping well.   Reviewed notes, labs and imaging from outside physicians, which showed:  Reviewed records from Dr. [name] my colleague who previously saw patient.  Patient stopped taking only daily preventative medications in May 2019.  When she was seen in July she was experiencing 15 headache days a month.  No medication overuse.  She started going to a chiropractor.  Prior to that she had a history of migraine headaches for many years.  She has a history of migraines for 20 years previously under the care of Dr. Melton Alar since 2018 at and neurologist Dr. Suanne Marker for 14 years prior to that.  Her most recent visit with Dr. [name] she was given a lidocaine nose spray and Imitrex for acute management.  List of prior medications include Neurontin, doxepin, amitriptyline, Lamictal, nortriptyline, Tylenol, Effexor, Topamax, verapamil and Keppra.  And per notes, MRI of the brain without contrast in November 2016 and MRI of the brain without contrast 5-year 2016 were unremarkable.   At Dr. Rhea Belton appointment she was started on Effexor.   Review of Systems: Patient complains of symptoms per HPI as well as the following symptoms: migraine. Pertinent negatives and positives per HPI. All others negative.   Social History   Socioeconomic History  . Marital status: Single    Spouse name: Not on file  . Number of children: 4  . Years of education: college  . Highest education level: Associate degree:  occupational, Hotel manager, or vocational program  Occupational History  . Occupation: Disabled by migraines  Social Needs  . Financial resource strain: Not on file  . Food insecurity:    Worry: Not on file    Inability: Not on file  . Transportation needs:    Medical: Not on file    Non-medical: Not on file  Tobacco Use  . Smoking status: Never Smoker  . Smokeless tobacco: Never Used  Substance and Sexual  Activity  . Alcohol use: No    Comment: no use since 2012  . Drug use: No  . Sexual activity: Yes    Birth control/protection: Surgical  Lifestyle  . Physical activity:    Days per week: Not on file    Minutes per session: Not on file  . Stress: Not on file  Relationships  . Social connections:    Talks on phone: Not on file    Gets together: Not on file    Attends religious service: Not on file    Active member of club or organization: Not on file    Attends meetings of clubs or organizations: Not on file    Relationship status: Not on file  . Intimate partner violence:    Fear of current or ex partner: Not on file    Emotionally abused: Not on file    Physically abused: Not on file    Forced sexual activity: Not on file  Other Topics Concern  . Not on file  Social History Narrative   Lives at home alone.   Right-handed.   2 cups coffee per day.    Family History  Problem Relation Age of Onset  . Diabetes Father   . Kidney disease Father   . Aneurysm Sister     Past Medical History:  Diagnosis Date  . Arthritis   . Fibromyalgia    per pt has left hand mild numbness/ weakness and left foot due to fibromyalgia  . History of esophageal dilatation    per pt x5  last one 2012  approx.  Marland Kitchen History of exercise stress test 03-01-2010   dr hochrein   no evidence ischemia/  also Stress Echo test done-- with stress there was a thickening and contractility in all segments, there is decrease in cavity size in all views; this is a normal stress echo study;  normal LV global function and wall motion, ef 555%  . IBS (irritable bowel syndrome)    diet controlled. no meds  . Iron deficiency anemia   . Menorrhagia   . Migraines   . Seasonal allergies   . Wears glasses     Past Surgical History:  Procedure Laterality Date  . DILATATION & CURETTAGE/HYSTEROSCOPY WITH MYOSURE N/A 06/21/2015   Procedure: DILATATION & CURETTAGE/HYSTEROSCOPY WITH MYOSURE;  Surgeon: [name],  DO;  Location: [facility];  Service: Gynecology;  Laterality: N/A;  . DILITATION & CURRETTAGE/HYSTROSCOPY WITH NOVASURE ABLATION N/A 01/10/2017   Procedure: DILATATION & CURETTAGE/HYSTEROSCOPY WITH NOVASURE ABLATION;  Surgeon: [name], DO;  Location: [facility];  Service: Gynecology;  Laterality: N/A;  . LAPAROSCOPIC CHOLECYSTECTOMY  2008  . MASS EXCISION Right 11/22/2012   Procedure: EXCISION RIGHT GROIN MASS ;  Surgeon: Harl Bowie, MD;  Location: [facility];  Service: General;  Laterality: Right;  . TUBAL LIGATION Bilateral 1995   PPTL  . UPPER GI ENDOSCOPY  x5  last one 2012  approx.   w/ dilations    Current Outpatient Medications  Medication  Sig Dispense Refill  . acetaminophen (TYLENOL) 500 MG tablet Take 500 mg every 6 (six) hours as needed by mouth.    Eduard Roux (AIMOVIG) 140 MG/ML SOAJ Inject 140 mg into the skin every 30 (thirty) days. 6 pen 0  . Ferrous Sulfate (IRON) 325 (65 Fe) MG TABS Take 1 tablet daily by mouth.    . Probiotic Product (PROBIOTIC DAILY) CAPS Take 1 capsule daily by mouth.    . promethazine (PHENERGAN) 25 MG tablet Take 25 mg every 6 (six) hours as needed by mouth for nausea or vomiting.    . rizatriptan (MAXALT-MLT) 10 MG disintegrating tablet Take 1 tablet (10 mg total) by mouth as needed. May repeat in 2 hours if needed 12 tablet 11  . tiZANidine (ZANAFLEX) 4 MG tablet Take 1 tablet (4 mg total) by mouth every 6 (six) hours as needed for muscle spasms. 30 tablet 11   No current facility-administered medications for this visit.     Allergies as of 03/27/2018 - Review Complete 12/21/2017  Allergen Reaction Noted  . Gluten meal  01/01/2017  . Morphine and related Nausea And Vomiting 11/12/2012  . Tape Hives 11/15/2012    Vitals: There were no vitals taken for this visit. Last Weight:  Wt Readings from Last 1 Encounters:  12/10/17 156 lb (70.8 kg)   Last Height:   Ht Readings from Last 1 Encounters:  12/10/17 5' 4.5"  (1.638 m)   Physical exam: Exam: Gen: NAD, conversant, well nourised, well groomed                     CV: RRR, no MRG. No Carotid Bruits. No peripheral edema, warm, nontender Eyes: Conjunctivae clear without exudates or hemorrhage  Neuro: Detailed Neurologic Exam  Speech:    Speech is normal; fluent and spontaneous with normal comprehension.  Cognition:    The patient is oriented to person, place, and time;     recent and remote memory intact;     language fluent;     normal attention, concentration,     fund of knowledge Cranial Nerves:    The pupils are equal, round, and reactive to light. The fundi are normal and spontaneous venous pulsations are present. Visual fields are full to finger confrontation. Extraocular movements are intact. Trigeminal sensation is intact and the muscles of mastication are normal. The face is symmetric. The palate elevates in the midline. Hearing intact. Voice is normal. Shoulder shrug is normal. The tongue has normal motion without fasciculations.   Coordination:    Normal finger to nose and heel to shin. Normal rapid alternating movements.   Gait:    Heel-toe and tandem gait are normal.   Motor Observation:    No asymmetry, no atrophy, and no involuntary movements noted. Tone:    Normal muscle tone.    Posture:    Posture is normal. normal erect    Strength:    Strength is V/V in the upper and lower limbs.      Sensation: intact to LT     Reflex Exam:  DTR's:    Deep tendon reflexes in the upper and lower extremities are normal bilaterally.   Toes:    The toes are downgoing bilaterally.   Clonus:    Clonus is absent.       Assessment/Plan:  48 year old with chronic intractable migraines without aura. Failed multiple classes of medications. No medication overuse.  - has morning headaches most days of the week, she  wakes with them, feels short of breath at night and wakes up feeling like she can;t breath. Sleep eval. Refractory  headaches has tried multiple meds, cgrp, botox. She is excessively fatigued. Sleep eval.   Orders Placed This Encounter  Procedures  . Ambulatory referral to Sleep Studies    - Continue botox - failed the cgrps aimovig and emgality, stop - second botox - for neck pain: she has a chiropractor, performs biofeedback and acupuncture, sleep well and eats well, meditation. Impressive, continue -insomnia: discussed good sleep hygiene  Discussed: To prevent or relieve headaches, try the following: Cool Compress. Lie down and place a cool compress on your head.  Avoid headache triggers. If certain foods or odors seem to have triggered your migraines in the past, avoid them. A headache diary might help you identify triggers.  Include physical activity in your daily routine. Try a daily walk or other moderate aerobic exercise.  Manage stress. Find healthy ways to cope with the stressors, such as delegating tasks on your to-do list.  Practice relaxation techniques. Try deep breathing, yoga, massage and visualization.  Eat regularly. Eating regularly scheduled meals and maintaining a healthy diet might help prevent headaches. Also, drink plenty of fluids.  Follow a regular sleep schedule. Sleep deprivation might contribute to headaches Consider biofeedback. With this mind-body technique, you learn to control certain bodily functions - such as muscle tension, heart rate and blood pressure - to prevent headaches or reduce headache pain.    Proceed to emergency room if you experience new or worsening symptoms or symptoms do not resolve, if you have new neurologic symptoms or if headache is severe, or for any concerning symptom.   Provided education and documentation from American headache Society toolbox including articles on: chronic migraine medication overuse headache, chronic migraines, prevention of migraines, behavioral and other nonpharmacologic treatments for headache.  Cc: [name],  DO  Sarina Ill, MD  [facility] [address] [place], [place] [address]  Phone [phone] Fax [phone]  A total of 25 minutes was spent face-to-face with this patient. Over half this time was spent on counseling patient on the  1. Chronic migraine without aura, with intractable migraine, so stated, with status migrainosus   2. Insomnia, unspecified type   3. Neck pain     diagnosis and different diagnostic and therapeutic options, counseling and coordination of care, risks ans benefits of management, compliance, or risk factor reduction and education.  This does not include time spent on botox procedure.

## 2018-03-27 NOTE — Telephone Encounter (Signed)
12 wk btx

## 2018-03-27 NOTE — Progress Notes (Signed)
Botox- 100 units x 2 vials Lot: [id number] Expiration: 09/2020 NDC: 0023-1145-01  Bacteriostatic 0.9% Sodium Chloride- 4mL total Lot: [id number] Expiration: 11/14/2018 NDC: 0409-1966-02  Dx: G43.711 B/B   

## 2018-03-27 NOTE — Patient Instructions (Signed)

## 2018-03-27 NOTE — Progress Notes (Signed)
Consent Form Botulism Toxin Injection For Chronic Migraine  03/27/2018: This is patient's second botox. Her baseline: She has daily headaches and at least 15 migraine days a month at baseline.  Reviewed orally with patient, additionally signature is on file:  Botulism toxin has been approved by the Federal drug administration for treatment of chronic migraine. Botulism toxin does not cure chronic migraine and it may not be effective in some patients.  The administration of botulism toxin is accomplished by injecting a small amount of toxin into the muscles of the neck and head. Dosage must be titrated for each individual. Any benefits resulting from botulism toxin tend to wear off after 3 months with a repeat injection required if benefit is to be maintained. Injections are usually done every 3-4 months with maximum effect peak achieved by about 2 or 3 weeks. Botulism toxin is expensive and you should be sure of what costs you will incur resulting from the injection.  The side effects of botulism toxin use for chronic migraine may include:   -Transient, and usually mild, facial weakness with facial injections  -Transient, and usually mild, head or neck weakness with head/neck injections  -Reduction or loss of forehead facial animation due to forehead muscle weakness  -Eyelid drooping  -Dry eye  -Pain at the site of injection or bruising at the site of injection  -Double vision  -Potential unknown long term risks  Contraindications: You should not have Botox if you are pregnant, nursing, allergic to albumin, have an infection, skin condition, or muscle weakness at the site of the injection, or have myasthenia gravis, Lambert-Eaton syndrome, or ALS.  It is also possible that as with any injection, there may be an allergic reaction or no effect from the medication. Reduced effectiveness after repeated injections is sometimes seen and rarely infection at the injection site may occur. All care  will be taken to prevent these side effects. If therapy is given over a long time, atrophy and wasting in the muscle injected may occur. Occasionally the patient's become refractory to treatment because they develop antibodies to the toxin. In this event, therapy needs to be modified.  I have read the above information and consent to the administration of botulism toxin.    BOTOX PROCEDURE NOTE FOR MIGRAINE HEADACHE    Contraindications and precautions discussed with patient(above). Aseptic procedure was observed and patient tolerated procedure. Procedure performed by Dr. Georgia Dom  The condition has existed for more than 6 months, and pt does not have a diagnosis of ALS, Myasthenia Gravis or Lambert-Eaton Syndrome.  Risks and benefits of injections discussed and pt agrees to proceed with the procedure.  Written consent obtained  These injections are medically necessary. Pt  receives good benefits from these injections. These injections do not cause sedations or hallucinations which the oral therapies may cause.  Description of procedure:  The patient was placed in a sitting position. The standard protocol was used for Botox as follows, with 5 units of Botox injected at each site:   -Procerus muscle, midline injection  -Corrugator muscle, bilateral injection  -Frontalis muscle, bilateral injection, with 2 sites each side, medial injection was performed in the upper one third of the frontalis muscle, in the region vertical from the medial inferior edge of the superior orbital rim. The lateral injection was again in the upper one third of the forehead vertically above the lateral limbus of the cornea, 1.5 cm lateral to the medial injection site.  - Levator Scapulae: 5  units bilaterally  -Temporalis muscle injection, 5 sites, bilaterally. The first injection was 3 cm above the tragus of the ear, second injection site was 1.5 cm to 3 cm up from the first injection site in line with the  tragus of the ear. The third injection site was 1.5-3 cm forward between the first 2 injection sites. The fourth injection site was 1.5 cm posterior to the second injection site. 5th site laterally in the temporalis  muscleat the level of the outer canthus.  - Patient feels her clenching is a trigger for headaches. +5 units masseter bilaterally   - Patient feels the migraines are centered around the eyes +5 units bilaterally at the outer canthus in the orbicularis occuli  -Occipitalis muscle injection, 3 sites, bilaterally. The first injection was done one half way between the occipital protuberance and the tip of the mastoid process behind the ear. The second injection site was done lateral and superior to the first, 1 fingerbreadth from the first injection. The third injection site was 1 fingerbreadth superiorly and medially from the first injection site.  -Cervical paraspinal muscle injection, 2 sites, bilateral knee first injection site was 1 cm from the midline of the cervical spine, 3 cm inferior to the lower border of the occipital protuberance. The second injection site was 1.5 cm superiorly and laterally to the first injection site.  -Trapezius muscle injection was performed at 3 sites, bilaterally. The first injection site was in the upper trapezius muscle halfway between the inflection point of the neck, and the acromion. The second injection site was one half way between the acromion and the first injection site. The third injection was done between the first injection site and the inflection point of the neck.   Will return for repeat injection in 3 months.   A 200 unit sof Botox was used, any Botox not injected was wasted. The patient tolerated the procedure well, there were no complications of the above procedure.

## 2018-04-03 NOTE — Telephone Encounter (Signed)
I called to schedule the patient but they did not answer so I left a VM asking them to call me back.  ° °

## 2018-05-06 ENCOUNTER — Telehealth: Payer: Self-pay | Admitting: Neurology

## 2018-05-06 NOTE — Telephone Encounter (Signed)
Called the patient to inform them that our office has placed new protocols in place for our office visits. Due to the virus pandemic our office is reducing our number of office visits in order to minimize the risk to our patients and healthcare providers. Advised that our office is now providing the capability to offer the patients phone visits at this time. Informed of what that process looks like and informed that the telephone office visit will still be billed through insurance and due to [place] we need them to know since the appointment is taking place over the phone, we can't guarantee the security of the phone line. With that said if we do move forward I would have to get verbal consent to completed the call over the phone. Pt verbalized understanding and is agreeable to the phone visit. Reviewed the patient's chart and H&P on file as well as medications and made sure they were accurate. Patient will be by the phone for tomorrow between 8:45 am and 9:30 am awaiting call from Dr Brett Fairy.

## 2018-05-07 ENCOUNTER — Telehealth (INDEPENDENT_AMBULATORY_CARE_PROVIDER_SITE_OTHER): Payer: Medicare Other | Admitting: Neurology

## 2018-05-07 ENCOUNTER — Other Ambulatory Visit: Payer: Self-pay

## 2018-05-07 DIAGNOSIS — R51 Headache: Secondary | ICD-10-CM | POA: Diagnosis not present

## 2018-05-07 DIAGNOSIS — G478 Other sleep disorders: Secondary | ICD-10-CM

## 2018-05-07 DIAGNOSIS — R519 Headache, unspecified: Secondary | ICD-10-CM

## 2018-05-07 DIAGNOSIS — G4700 Insomnia, unspecified: Secondary | ICD-10-CM

## 2018-05-07 DIAGNOSIS — G43101 Migraine with aura, not intractable, with status migrainosus: Secondary | ICD-10-CM

## 2018-05-07 DIAGNOSIS — G43109 Migraine with aura, not intractable, without status migrainosus: Secondary | ICD-10-CM

## 2018-05-07 NOTE — Progress Notes (Signed)
Virtual Visit via Video Note  I connected with Elizabeth Kennedy on 05/07/18 at  9:00 AM EDT by a video enabled telemedicine application and verified that I am speaking with the correct person using two identifiers.   I discussed the limitations of evaluation and management by telemedicine and the availability of in person appointments. The patient expressed understanding and agreed to proceed.  History of Present Illness: Headaches being related to a sleep disorder.  20 year history of migraine, now on her 4th neurologist. Daily headaches of a tension component.  This patient has seen my colleague Dr. Jaynee Eagles, previously seen by Dr. Annamaria Boots, Dr. Helaine Chess, and has tried lidocaine nose spray, Imitrex for acute management, preventive medications that have failed to include Neurontin, doxepin, amitriptyline, Lamictal, nortriptyline, Tylenol, Effexor, Topamax, verapamil and Keppra.  As per notes an MRI of the brain with contrast in 2016 was unremarkable.  She has morning headaches most days of the week she wakes with them she feels short of breath at night wakes up feeling that she cannot breathe refractory headaches.  Excessive fatigue.  She is not sleeping well and has neck pain, too.  Dinner time fluctuates, bedtime is always 11 PM. Lays awake, average 1 hours, exceptions of 2-3  hours are known. Dreams vividly, wakes up every 60- 90 minutes, the intervals of sleep shorten as the progresses. Bedroom is cool, quiet, dark.  Sleeps on her side, on 1 pillow. Supine sleep causes gag or choking. Rises at 7 AM spontaneously.  Feels not refreshed, has usually a headache. Morning headache described as pressure between brows and back of the head.   How likely are you to doze in the following situations: 0 = not likely, 1 = slight chance, 2 = moderate chance, 3 = high chance  Sitting and Reading? 0 Watching Television? 0 Sitting inactive in a public place (theater or meeting)?0 Lying down in the  afternoon when circumstances permit? 1-2 Sitting and talking to someone?0 Sitting quietly after lunch without alcohol? 0 In a car, while stopped for a few minutes in traffic?0 As a passenger in a car for an hour without a break?0  Total = 2-3 points. Fatigue is endorsed.  Lives alone, sleeps alone: 2 cups of coffee daily, no etoh, nor tobacco.     Observations/Objective: either sleep deprived, hypoxemia, causing fatigue and morning headache.    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 Sleeps alone.    Follow Up Instructions: HST    I discussed the assessment and treatment plan with the patient. The patient was provided an opportunity to ask questions and all were answered. The patient agreed with the plan and demonstrated an understanding of the instructions.   The patient was advised to call back or seek an in-person evaluation if the symptoms worsen or if the condition fails to improve as anticipated.  I provided 30 minutes of non-face-to-face time during this encounter.   Larey Seat, MD   05-07-2018

## 2018-05-07 NOTE — Patient Instructions (Signed)
Home sleep test ordered. Cloud access confirmed, wifi capability.

## 2018-05-20 ENCOUNTER — Ambulatory Visit (INDEPENDENT_AMBULATORY_CARE_PROVIDER_SITE_OTHER): Payer: Medicare Other | Admitting: Neurology

## 2018-05-20 ENCOUNTER — Other Ambulatory Visit: Payer: Self-pay

## 2018-05-20 DIAGNOSIS — G4733 Obstructive sleep apnea (adult) (pediatric): Secondary | ICD-10-CM | POA: Diagnosis not present

## 2018-05-20 DIAGNOSIS — R51 Headache: Principal | ICD-10-CM

## 2018-05-20 DIAGNOSIS — R519 Headache, unspecified: Secondary | ICD-10-CM

## 2018-05-20 DIAGNOSIS — G43101 Migraine with aura, not intractable, with status migrainosus: Secondary | ICD-10-CM

## 2018-05-20 DIAGNOSIS — G478 Other sleep disorders: Secondary | ICD-10-CM

## 2018-05-20 DIAGNOSIS — G43109 Migraine with aura, not intractable, without status migrainosus: Secondary | ICD-10-CM

## 2018-05-20 DIAGNOSIS — G4700 Insomnia, unspecified: Secondary | ICD-10-CM

## 2018-06-05 NOTE — Procedures (Signed)
Patient Information     First Name: Elizabeth Last Name: Kennedy ID: [id number]  Birth Date: January 18, 1971 Age: 48 Gender: Female  Referring Doctor: Melvenia Beam, MD BMI: 26.1 (W=156 lb, H=5' 5'')  Reading Doctor:  Neck Circum:              Larey Seat, MD  15 Epworth:  3/24 :  Sleep Study Information    Study Date: May 23, 2018 S/H/A Version: [id number] / 4.1.1531 / 62  History:  Question of Headaches being related to a sleep disorder. 20 year history of migraine, now with daily headaches of tension component. This patient has seen my colleague Dr. Jaynee Eagles, previously seen by Dr. Annamaria Boots, Dr. Melton Alar, and has tried:  lidocaine nose spray, Imitrex for acute management, preventive medications that have failed to include Neurontin, doxepin, amitriptyline, Lamictal, nortriptyline, Tylenol, Effexor, Topamax, verapamil and Keppra.  She has morning headaches most days of the week she wakes with them she feels short of breath at night wakes up feeling that she cannot breathe refractory headaches.  Excessive fatigue.  She is not sleeping well and has neck pain, too. Dinner time fluctuates, bedtime is always 11 PM. Lays awake, average 1 hours, exceptions of 2-3 hours are known. Dreams vividly, wakes up every 60- 90 minutes, the intervals of sleep shorten as the night progresses. Supine sleep causes gag or choking. Rises at 7 AM spontaneously. Feels not refreshed, has usually a headache. Morning headache described as pressure between brows and back of the head.       Summary & Diagnosis:     Mild Sleep Apnea with AHI of 9.0, RDI of 13.4/h and REM AHI of 14.7/h. Snoring is present, REM sleep accentuates the apnea. No clinically significant hypoxemia noted.  Calculated total sleep time almost 8 hours.   Recommendations:      The mild degree of Apnea appears not to cause Hypersomnia (Epworth Sleepiness Score) and is not associated with hypoxemia, there by unlikely unrelated to Headaches. A dental device  for snoring treatment can be offered, even a CPAP trial and follow up to review the effects on Headaches.  Please offer patient a trial of CPAP or referral to dental device.  Electronically Signed: Larey Seat, MD  06-05-2018   Sleep Summary  Oxygen Saturation Statistics   Start Study Time: End Study Time: Total Recording Time:  10:20:40 PM  6:22:19 AM  8 h, 1 min  Total Sleep Time % REM of Sleep Time:  7 h, 25 min  35.9%    Mean: 95%  Minimum: 91%  Maximum: 100%   Mean of Desaturations Nadirs (%): 94%     Oxygen Desaturation %: 4-9 10-20 >20 Total  Events Number Total  20 100.0  0 0.0  0 0.0  20 100.0  Oxygen Saturation: <90 <=88 <85 <80 <70  Duration (minutes): Sleep % 0.0 0.0 0.0 0.0 0.0 0.0 0.0 0.0 0.0 0.0     Respiratory Indices      Total Events REM NREM All Night  pRDI:  99  pAHI:  67 ODI:  20  pAHIc:  2  % CSR: 0.0 21.4 14.7 3.8 0.4 8.8 5.9 2.1 0.2 13.4 9.0 2.7 0.3       Pulse Rate Statistics during Sleep (BPM)      Mean: 66 Minimum: 47 Maximum: 97    Indices are calculated using technically valid sleep time of  7 hrs, 24 min. pRDI/pAHI are calculated using oxi desaturations ? 3%  Body Position Statistics  Position Supine Prone Right Left Non-Supine  Sleep (min) 365.5 0.0 20.5 59.6 80.1  Sleep % 82.0 0.0 4.6 13.4 18.0  pRDI 13.3 N/A 8.8 15.2 13.5  pAHI 8.7 N/A 2.9 13.2 10.5  ODI 2.3 N/A 0.0 6.1 4.5     Snoring Statistics Snoring Level (dB) >40 >50 >60 >70 >80 >Threshold (45)  Sleep (min) 19.2 2.7 1.0 0.0 0.0 4.5  Sleep % 4.3 0.6 0.2 0.0 0.0 1.0    Mean: 40 dB Sleep Stages Chart              pAHI=9.0                           Mild              Moderate                    Severe                                                    5              15                    30  * Reference values are according to AASM guidelines

## 2018-06-05 NOTE — Addendum Note (Signed)
Addended by: Larey Seat on: 06/05/2018 04:46 PM   Modules accepted: Orders

## 2018-06-06 ENCOUNTER — Telehealth: Payer: Self-pay | Admitting: Neurology

## 2018-06-06 NOTE — Telephone Encounter (Signed)
Called patient to discuss sleep study results. No answer at this time. LVM for the patient to call back.  Will also send a mychart message.  

## 2018-06-06 NOTE — Telephone Encounter (Signed)
-----   Message from Larey Seat, MD sent at 06/05/2018  4:46 PM EDT ----- Summary & Diagnosis:    Mild Sleep Apnea with AHI of 9.0, RDI of 13.4/h and REM AHI of  14.7/h. Snoring is present, REM sleep accentuates the apnea. No  clinically significant hypoxemia noted.  Calculated total sleep time almost 8 hours.   Recommendations:    The mild degree of Apnea appears not to cause Hypersomnia  (Epworth Sleepiness Score) and is not associated with hypoxemia,  there by unlikely unrelated to Headaches. A dental device for  snoring treatment can be offered, even a CPAP trial and follow up  to review the effects on Headaches.

## 2018-06-20 DIAGNOSIS — M9902 Segmental and somatic dysfunction of thoracic region: Secondary | ICD-10-CM | POA: Diagnosis not present

## 2018-06-20 DIAGNOSIS — M40292 Other kyphosis, cervical region: Secondary | ICD-10-CM | POA: Diagnosis not present

## 2018-06-20 DIAGNOSIS — M9901 Segmental and somatic dysfunction of cervical region: Secondary | ICD-10-CM | POA: Diagnosis not present

## 2018-06-20 DIAGNOSIS — G43009 Migraine without aura, not intractable, without status migrainosus: Secondary | ICD-10-CM | POA: Diagnosis not present

## 2018-06-24 DIAGNOSIS — M9902 Segmental and somatic dysfunction of thoracic region: Secondary | ICD-10-CM | POA: Diagnosis not present

## 2018-06-24 DIAGNOSIS — M9905 Segmental and somatic dysfunction of pelvic region: Secondary | ICD-10-CM | POA: Diagnosis not present

## 2018-06-24 DIAGNOSIS — M9903 Segmental and somatic dysfunction of lumbar region: Secondary | ICD-10-CM | POA: Diagnosis not present

## 2018-06-24 DIAGNOSIS — M9901 Segmental and somatic dysfunction of cervical region: Secondary | ICD-10-CM | POA: Diagnosis not present

## 2018-06-25 DIAGNOSIS — M9905 Segmental and somatic dysfunction of pelvic region: Secondary | ICD-10-CM | POA: Diagnosis not present

## 2018-06-25 DIAGNOSIS — M9903 Segmental and somatic dysfunction of lumbar region: Secondary | ICD-10-CM | POA: Diagnosis not present

## 2018-06-25 DIAGNOSIS — M9902 Segmental and somatic dysfunction of thoracic region: Secondary | ICD-10-CM | POA: Diagnosis not present

## 2018-06-25 DIAGNOSIS — M9901 Segmental and somatic dysfunction of cervical region: Secondary | ICD-10-CM | POA: Diagnosis not present

## 2018-06-27 DIAGNOSIS — M9903 Segmental and somatic dysfunction of lumbar region: Secondary | ICD-10-CM | POA: Diagnosis not present

## 2018-06-27 DIAGNOSIS — M9901 Segmental and somatic dysfunction of cervical region: Secondary | ICD-10-CM | POA: Diagnosis not present

## 2018-06-27 DIAGNOSIS — M9905 Segmental and somatic dysfunction of pelvic region: Secondary | ICD-10-CM | POA: Diagnosis not present

## 2018-06-27 DIAGNOSIS — M9902 Segmental and somatic dysfunction of thoracic region: Secondary | ICD-10-CM | POA: Diagnosis not present

## 2018-07-01 DIAGNOSIS — M9902 Segmental and somatic dysfunction of thoracic region: Secondary | ICD-10-CM | POA: Diagnosis not present

## 2018-07-01 DIAGNOSIS — M9905 Segmental and somatic dysfunction of pelvic region: Secondary | ICD-10-CM | POA: Diagnosis not present

## 2018-07-01 DIAGNOSIS — M9903 Segmental and somatic dysfunction of lumbar region: Secondary | ICD-10-CM | POA: Diagnosis not present

## 2018-07-01 DIAGNOSIS — M9901 Segmental and somatic dysfunction of cervical region: Secondary | ICD-10-CM | POA: Diagnosis not present

## 2018-07-02 DIAGNOSIS — M9905 Segmental and somatic dysfunction of pelvic region: Secondary | ICD-10-CM | POA: Diagnosis not present

## 2018-07-02 DIAGNOSIS — M9902 Segmental and somatic dysfunction of thoracic region: Secondary | ICD-10-CM | POA: Diagnosis not present

## 2018-07-02 DIAGNOSIS — M9903 Segmental and somatic dysfunction of lumbar region: Secondary | ICD-10-CM | POA: Diagnosis not present

## 2018-07-02 DIAGNOSIS — M9901 Segmental and somatic dysfunction of cervical region: Secondary | ICD-10-CM | POA: Diagnosis not present

## 2018-07-03 DIAGNOSIS — M9903 Segmental and somatic dysfunction of lumbar region: Secondary | ICD-10-CM | POA: Diagnosis not present

## 2018-07-03 DIAGNOSIS — M9905 Segmental and somatic dysfunction of pelvic region: Secondary | ICD-10-CM | POA: Diagnosis not present

## 2018-07-03 DIAGNOSIS — M9902 Segmental and somatic dysfunction of thoracic region: Secondary | ICD-10-CM | POA: Diagnosis not present

## 2018-07-03 DIAGNOSIS — M9901 Segmental and somatic dysfunction of cervical region: Secondary | ICD-10-CM | POA: Diagnosis not present

## 2018-07-09 DIAGNOSIS — M9902 Segmental and somatic dysfunction of thoracic region: Secondary | ICD-10-CM | POA: Diagnosis not present

## 2018-07-09 DIAGNOSIS — M9901 Segmental and somatic dysfunction of cervical region: Secondary | ICD-10-CM | POA: Diagnosis not present

## 2018-07-09 DIAGNOSIS — M9905 Segmental and somatic dysfunction of pelvic region: Secondary | ICD-10-CM | POA: Diagnosis not present

## 2018-07-09 DIAGNOSIS — M9903 Segmental and somatic dysfunction of lumbar region: Secondary | ICD-10-CM | POA: Diagnosis not present

## 2018-07-10 DIAGNOSIS — M9905 Segmental and somatic dysfunction of pelvic region: Secondary | ICD-10-CM | POA: Diagnosis not present

## 2018-07-10 DIAGNOSIS — M9903 Segmental and somatic dysfunction of lumbar region: Secondary | ICD-10-CM | POA: Diagnosis not present

## 2018-07-10 DIAGNOSIS — M9901 Segmental and somatic dysfunction of cervical region: Secondary | ICD-10-CM | POA: Diagnosis not present

## 2018-07-10 DIAGNOSIS — M9902 Segmental and somatic dysfunction of thoracic region: Secondary | ICD-10-CM | POA: Diagnosis not present

## 2018-07-18 DIAGNOSIS — M9903 Segmental and somatic dysfunction of lumbar region: Secondary | ICD-10-CM | POA: Diagnosis not present

## 2018-07-18 DIAGNOSIS — M9901 Segmental and somatic dysfunction of cervical region: Secondary | ICD-10-CM | POA: Diagnosis not present

## 2018-07-18 DIAGNOSIS — M9905 Segmental and somatic dysfunction of pelvic region: Secondary | ICD-10-CM | POA: Diagnosis not present

## 2018-07-18 DIAGNOSIS — M9902 Segmental and somatic dysfunction of thoracic region: Secondary | ICD-10-CM | POA: Diagnosis not present

## 2018-07-22 DIAGNOSIS — M9905 Segmental and somatic dysfunction of pelvic region: Secondary | ICD-10-CM | POA: Diagnosis not present

## 2018-07-22 DIAGNOSIS — M9902 Segmental and somatic dysfunction of thoracic region: Secondary | ICD-10-CM | POA: Diagnosis not present

## 2018-07-22 DIAGNOSIS — M9903 Segmental and somatic dysfunction of lumbar region: Secondary | ICD-10-CM | POA: Diagnosis not present

## 2018-07-22 DIAGNOSIS — M9901 Segmental and somatic dysfunction of cervical region: Secondary | ICD-10-CM | POA: Diagnosis not present

## 2018-07-25 DIAGNOSIS — M9902 Segmental and somatic dysfunction of thoracic region: Secondary | ICD-10-CM | POA: Diagnosis not present

## 2018-07-25 DIAGNOSIS — M9905 Segmental and somatic dysfunction of pelvic region: Secondary | ICD-10-CM | POA: Diagnosis not present

## 2018-07-25 DIAGNOSIS — M9903 Segmental and somatic dysfunction of lumbar region: Secondary | ICD-10-CM | POA: Diagnosis not present

## 2018-07-25 DIAGNOSIS — M9901 Segmental and somatic dysfunction of cervical region: Secondary | ICD-10-CM | POA: Diagnosis not present

## 2018-07-29 DIAGNOSIS — M40292 Other kyphosis, cervical region: Secondary | ICD-10-CM | POA: Diagnosis not present

## 2018-07-29 DIAGNOSIS — G43009 Migraine without aura, not intractable, without status migrainosus: Secondary | ICD-10-CM | POA: Diagnosis not present

## 2018-07-29 DIAGNOSIS — M9902 Segmental and somatic dysfunction of thoracic region: Secondary | ICD-10-CM | POA: Diagnosis not present

## 2018-07-29 DIAGNOSIS — M9901 Segmental and somatic dysfunction of cervical region: Secondary | ICD-10-CM | POA: Diagnosis not present

## 2018-07-31 DIAGNOSIS — M9902 Segmental and somatic dysfunction of thoracic region: Secondary | ICD-10-CM | POA: Diagnosis not present

## 2018-07-31 DIAGNOSIS — G43009 Migraine without aura, not intractable, without status migrainosus: Secondary | ICD-10-CM | POA: Diagnosis not present

## 2018-07-31 DIAGNOSIS — M9901 Segmental and somatic dysfunction of cervical region: Secondary | ICD-10-CM | POA: Diagnosis not present

## 2018-07-31 DIAGNOSIS — M40292 Other kyphosis, cervical region: Secondary | ICD-10-CM | POA: Diagnosis not present

## 2018-08-02 DIAGNOSIS — M9901 Segmental and somatic dysfunction of cervical region: Secondary | ICD-10-CM | POA: Diagnosis not present

## 2018-08-02 DIAGNOSIS — M9902 Segmental and somatic dysfunction of thoracic region: Secondary | ICD-10-CM | POA: Diagnosis not present

## 2018-08-02 DIAGNOSIS — G43009 Migraine without aura, not intractable, without status migrainosus: Secondary | ICD-10-CM | POA: Diagnosis not present

## 2018-08-02 DIAGNOSIS — M40292 Other kyphosis, cervical region: Secondary | ICD-10-CM | POA: Diagnosis not present

## 2018-08-05 DIAGNOSIS — M9902 Segmental and somatic dysfunction of thoracic region: Secondary | ICD-10-CM | POA: Diagnosis not present

## 2018-08-05 DIAGNOSIS — M40292 Other kyphosis, cervical region: Secondary | ICD-10-CM | POA: Diagnosis not present

## 2018-08-05 DIAGNOSIS — M9901 Segmental and somatic dysfunction of cervical region: Secondary | ICD-10-CM | POA: Diagnosis not present

## 2018-08-05 DIAGNOSIS — G43009 Migraine without aura, not intractable, without status migrainosus: Secondary | ICD-10-CM | POA: Diagnosis not present

## 2018-08-07 DIAGNOSIS — M9901 Segmental and somatic dysfunction of cervical region: Secondary | ICD-10-CM | POA: Diagnosis not present

## 2018-08-07 DIAGNOSIS — G43009 Migraine without aura, not intractable, without status migrainosus: Secondary | ICD-10-CM | POA: Diagnosis not present

## 2018-08-07 DIAGNOSIS — M9902 Segmental and somatic dysfunction of thoracic region: Secondary | ICD-10-CM | POA: Diagnosis not present

## 2018-08-07 DIAGNOSIS — M40292 Other kyphosis, cervical region: Secondary | ICD-10-CM | POA: Diagnosis not present

## 2018-08-12 DIAGNOSIS — G43009 Migraine without aura, not intractable, without status migrainosus: Secondary | ICD-10-CM | POA: Diagnosis not present

## 2018-08-12 DIAGNOSIS — M9902 Segmental and somatic dysfunction of thoracic region: Secondary | ICD-10-CM | POA: Diagnosis not present

## 2018-08-12 DIAGNOSIS — M9901 Segmental and somatic dysfunction of cervical region: Secondary | ICD-10-CM | POA: Diagnosis not present

## 2018-08-12 DIAGNOSIS — M40292 Other kyphosis, cervical region: Secondary | ICD-10-CM | POA: Diagnosis not present

## 2018-08-14 DIAGNOSIS — M40292 Other kyphosis, cervical region: Secondary | ICD-10-CM | POA: Diagnosis not present

## 2018-08-14 DIAGNOSIS — M9901 Segmental and somatic dysfunction of cervical region: Secondary | ICD-10-CM | POA: Diagnosis not present

## 2018-08-14 DIAGNOSIS — M9902 Segmental and somatic dysfunction of thoracic region: Secondary | ICD-10-CM | POA: Diagnosis not present

## 2018-08-14 DIAGNOSIS — G43009 Migraine without aura, not intractable, without status migrainosus: Secondary | ICD-10-CM | POA: Diagnosis not present

## 2018-08-16 DIAGNOSIS — G43009 Migraine without aura, not intractable, without status migrainosus: Secondary | ICD-10-CM | POA: Diagnosis not present

## 2018-08-16 DIAGNOSIS — M40292 Other kyphosis, cervical region: Secondary | ICD-10-CM | POA: Diagnosis not present

## 2018-08-16 DIAGNOSIS — M9902 Segmental and somatic dysfunction of thoracic region: Secondary | ICD-10-CM | POA: Diagnosis not present

## 2018-08-16 DIAGNOSIS — M9901 Segmental and somatic dysfunction of cervical region: Secondary | ICD-10-CM | POA: Diagnosis not present

## 2018-08-21 DIAGNOSIS — M9902 Segmental and somatic dysfunction of thoracic region: Secondary | ICD-10-CM | POA: Diagnosis not present

## 2018-08-21 DIAGNOSIS — M9901 Segmental and somatic dysfunction of cervical region: Secondary | ICD-10-CM | POA: Diagnosis not present

## 2018-08-21 DIAGNOSIS — G43009 Migraine without aura, not intractable, without status migrainosus: Secondary | ICD-10-CM | POA: Diagnosis not present

## 2018-08-21 DIAGNOSIS — M40292 Other kyphosis, cervical region: Secondary | ICD-10-CM | POA: Diagnosis not present

## 2018-08-23 DIAGNOSIS — M9902 Segmental and somatic dysfunction of thoracic region: Secondary | ICD-10-CM | POA: Diagnosis not present

## 2018-08-23 DIAGNOSIS — M9901 Segmental and somatic dysfunction of cervical region: Secondary | ICD-10-CM | POA: Diagnosis not present

## 2018-08-23 DIAGNOSIS — G43009 Migraine without aura, not intractable, without status migrainosus: Secondary | ICD-10-CM | POA: Diagnosis not present

## 2018-08-23 DIAGNOSIS — M40292 Other kyphosis, cervical region: Secondary | ICD-10-CM | POA: Diagnosis not present

## 2018-08-26 DIAGNOSIS — M9902 Segmental and somatic dysfunction of thoracic region: Secondary | ICD-10-CM | POA: Diagnosis not present

## 2018-08-26 DIAGNOSIS — M9901 Segmental and somatic dysfunction of cervical region: Secondary | ICD-10-CM | POA: Diagnosis not present

## 2018-08-26 DIAGNOSIS — G43009 Migraine without aura, not intractable, without status migrainosus: Secondary | ICD-10-CM | POA: Diagnosis not present

## 2018-08-26 DIAGNOSIS — M40292 Other kyphosis, cervical region: Secondary | ICD-10-CM | POA: Diagnosis not present

## 2018-08-28 DIAGNOSIS — M9902 Segmental and somatic dysfunction of thoracic region: Secondary | ICD-10-CM | POA: Diagnosis not present

## 2018-08-28 DIAGNOSIS — M9901 Segmental and somatic dysfunction of cervical region: Secondary | ICD-10-CM | POA: Diagnosis not present

## 2018-08-28 DIAGNOSIS — G43009 Migraine without aura, not intractable, without status migrainosus: Secondary | ICD-10-CM | POA: Diagnosis not present

## 2018-08-28 DIAGNOSIS — M40292 Other kyphosis, cervical region: Secondary | ICD-10-CM | POA: Diagnosis not present

## 2018-09-06 DIAGNOSIS — M9901 Segmental and somatic dysfunction of cervical region: Secondary | ICD-10-CM | POA: Diagnosis not present

## 2018-09-06 DIAGNOSIS — M9902 Segmental and somatic dysfunction of thoracic region: Secondary | ICD-10-CM | POA: Diagnosis not present

## 2018-09-06 DIAGNOSIS — G43009 Migraine without aura, not intractable, without status migrainosus: Secondary | ICD-10-CM | POA: Diagnosis not present

## 2018-09-06 DIAGNOSIS — M40292 Other kyphosis, cervical region: Secondary | ICD-10-CM | POA: Diagnosis not present

## 2018-09-09 DIAGNOSIS — M9901 Segmental and somatic dysfunction of cervical region: Secondary | ICD-10-CM | POA: Diagnosis not present

## 2018-09-09 DIAGNOSIS — M40292 Other kyphosis, cervical region: Secondary | ICD-10-CM | POA: Diagnosis not present

## 2018-09-09 DIAGNOSIS — G43009 Migraine without aura, not intractable, without status migrainosus: Secondary | ICD-10-CM | POA: Diagnosis not present

## 2018-09-09 DIAGNOSIS — M9902 Segmental and somatic dysfunction of thoracic region: Secondary | ICD-10-CM | POA: Diagnosis not present

## 2018-09-11 DIAGNOSIS — G43009 Migraine without aura, not intractable, without status migrainosus: Secondary | ICD-10-CM | POA: Diagnosis not present

## 2018-09-11 DIAGNOSIS — M9901 Segmental and somatic dysfunction of cervical region: Secondary | ICD-10-CM | POA: Diagnosis not present

## 2018-09-11 DIAGNOSIS — M9902 Segmental and somatic dysfunction of thoracic region: Secondary | ICD-10-CM | POA: Diagnosis not present

## 2018-09-11 DIAGNOSIS — M40292 Other kyphosis, cervical region: Secondary | ICD-10-CM | POA: Diagnosis not present

## 2018-09-17 DIAGNOSIS — M40292 Other kyphosis, cervical region: Secondary | ICD-10-CM | POA: Diagnosis not present

## 2018-09-17 DIAGNOSIS — M9901 Segmental and somatic dysfunction of cervical region: Secondary | ICD-10-CM | POA: Diagnosis not present

## 2018-09-17 DIAGNOSIS — G43009 Migraine without aura, not intractable, without status migrainosus: Secondary | ICD-10-CM | POA: Diagnosis not present

## 2018-09-17 DIAGNOSIS — M9902 Segmental and somatic dysfunction of thoracic region: Secondary | ICD-10-CM | POA: Diagnosis not present

## 2018-09-18 DIAGNOSIS — G43009 Migraine without aura, not intractable, without status migrainosus: Secondary | ICD-10-CM | POA: Diagnosis not present

## 2018-09-18 DIAGNOSIS — M40292 Other kyphosis, cervical region: Secondary | ICD-10-CM | POA: Diagnosis not present

## 2018-09-18 DIAGNOSIS — M9902 Segmental and somatic dysfunction of thoracic region: Secondary | ICD-10-CM | POA: Diagnosis not present

## 2018-09-18 DIAGNOSIS — M9901 Segmental and somatic dysfunction of cervical region: Secondary | ICD-10-CM | POA: Diagnosis not present

## 2018-09-19 DIAGNOSIS — G43009 Migraine without aura, not intractable, without status migrainosus: Secondary | ICD-10-CM | POA: Diagnosis not present

## 2018-09-19 DIAGNOSIS — M40292 Other kyphosis, cervical region: Secondary | ICD-10-CM | POA: Diagnosis not present

## 2018-09-19 DIAGNOSIS — M9901 Segmental and somatic dysfunction of cervical region: Secondary | ICD-10-CM | POA: Diagnosis not present

## 2018-09-19 DIAGNOSIS — M9902 Segmental and somatic dysfunction of thoracic region: Secondary | ICD-10-CM | POA: Diagnosis not present

## 2018-09-23 DIAGNOSIS — M9902 Segmental and somatic dysfunction of thoracic region: Secondary | ICD-10-CM | POA: Diagnosis not present

## 2018-09-23 DIAGNOSIS — M9901 Segmental and somatic dysfunction of cervical region: Secondary | ICD-10-CM | POA: Diagnosis not present

## 2018-09-23 DIAGNOSIS — G43009 Migraine without aura, not intractable, without status migrainosus: Secondary | ICD-10-CM | POA: Diagnosis not present

## 2018-09-23 DIAGNOSIS — M40292 Other kyphosis, cervical region: Secondary | ICD-10-CM | POA: Diagnosis not present

## 2018-09-30 DIAGNOSIS — M40292 Other kyphosis, cervical region: Secondary | ICD-10-CM | POA: Diagnosis not present

## 2018-09-30 DIAGNOSIS — M9901 Segmental and somatic dysfunction of cervical region: Secondary | ICD-10-CM | POA: Diagnosis not present

## 2018-09-30 DIAGNOSIS — M9902 Segmental and somatic dysfunction of thoracic region: Secondary | ICD-10-CM | POA: Diagnosis not present

## 2018-09-30 DIAGNOSIS — G43009 Migraine without aura, not intractable, without status migrainosus: Secondary | ICD-10-CM | POA: Diagnosis not present

## 2018-10-02 DIAGNOSIS — M9902 Segmental and somatic dysfunction of thoracic region: Secondary | ICD-10-CM | POA: Diagnosis not present

## 2018-10-02 DIAGNOSIS — M9901 Segmental and somatic dysfunction of cervical region: Secondary | ICD-10-CM | POA: Diagnosis not present

## 2018-10-02 DIAGNOSIS — G43009 Migraine without aura, not intractable, without status migrainosus: Secondary | ICD-10-CM | POA: Diagnosis not present

## 2018-10-02 DIAGNOSIS — M40292 Other kyphosis, cervical region: Secondary | ICD-10-CM | POA: Diagnosis not present

## 2018-10-04 DIAGNOSIS — M40292 Other kyphosis, cervical region: Secondary | ICD-10-CM | POA: Diagnosis not present

## 2018-10-04 DIAGNOSIS — G43009 Migraine without aura, not intractable, without status migrainosus: Secondary | ICD-10-CM | POA: Diagnosis not present

## 2018-10-04 DIAGNOSIS — M9902 Segmental and somatic dysfunction of thoracic region: Secondary | ICD-10-CM | POA: Diagnosis not present

## 2018-10-04 DIAGNOSIS — M9901 Segmental and somatic dysfunction of cervical region: Secondary | ICD-10-CM | POA: Diagnosis not present

## 2018-10-07 DIAGNOSIS — M40292 Other kyphosis, cervical region: Secondary | ICD-10-CM | POA: Diagnosis not present

## 2018-10-07 DIAGNOSIS — M9901 Segmental and somatic dysfunction of cervical region: Secondary | ICD-10-CM | POA: Diagnosis not present

## 2018-10-07 DIAGNOSIS — M9902 Segmental and somatic dysfunction of thoracic region: Secondary | ICD-10-CM | POA: Diagnosis not present

## 2018-10-07 DIAGNOSIS — G43009 Migraine without aura, not intractable, without status migrainosus: Secondary | ICD-10-CM | POA: Diagnosis not present

## 2018-10-09 DIAGNOSIS — M9902 Segmental and somatic dysfunction of thoracic region: Secondary | ICD-10-CM | POA: Diagnosis not present

## 2018-10-09 DIAGNOSIS — M40292 Other kyphosis, cervical region: Secondary | ICD-10-CM | POA: Diagnosis not present

## 2018-10-09 DIAGNOSIS — M9901 Segmental and somatic dysfunction of cervical region: Secondary | ICD-10-CM | POA: Diagnosis not present

## 2018-10-09 DIAGNOSIS — G43009 Migraine without aura, not intractable, without status migrainosus: Secondary | ICD-10-CM | POA: Diagnosis not present

## 2018-10-11 DIAGNOSIS — M40292 Other kyphosis, cervical region: Secondary | ICD-10-CM | POA: Diagnosis not present

## 2018-10-11 DIAGNOSIS — M9901 Segmental and somatic dysfunction of cervical region: Secondary | ICD-10-CM | POA: Diagnosis not present

## 2018-10-11 DIAGNOSIS — M9902 Segmental and somatic dysfunction of thoracic region: Secondary | ICD-10-CM | POA: Diagnosis not present

## 2018-10-11 DIAGNOSIS — G43009 Migraine without aura, not intractable, without status migrainosus: Secondary | ICD-10-CM | POA: Diagnosis not present

## 2018-10-16 DIAGNOSIS — M9901 Segmental and somatic dysfunction of cervical region: Secondary | ICD-10-CM | POA: Diagnosis not present

## 2018-10-16 DIAGNOSIS — G43009 Migraine without aura, not intractable, without status migrainosus: Secondary | ICD-10-CM | POA: Diagnosis not present

## 2018-10-16 DIAGNOSIS — M40292 Other kyphosis, cervical region: Secondary | ICD-10-CM | POA: Diagnosis not present

## 2018-10-16 DIAGNOSIS — M9902 Segmental and somatic dysfunction of thoracic region: Secondary | ICD-10-CM | POA: Diagnosis not present

## 2018-10-17 DIAGNOSIS — G43009 Migraine without aura, not intractable, without status migrainosus: Secondary | ICD-10-CM | POA: Diagnosis not present

## 2018-10-17 DIAGNOSIS — M40292 Other kyphosis, cervical region: Secondary | ICD-10-CM | POA: Diagnosis not present

## 2018-10-17 DIAGNOSIS — M9901 Segmental and somatic dysfunction of cervical region: Secondary | ICD-10-CM | POA: Diagnosis not present

## 2018-10-17 DIAGNOSIS — M9902 Segmental and somatic dysfunction of thoracic region: Secondary | ICD-10-CM | POA: Diagnosis not present

## 2018-10-18 DIAGNOSIS — M40292 Other kyphosis, cervical region: Secondary | ICD-10-CM | POA: Diagnosis not present

## 2018-10-18 DIAGNOSIS — G43009 Migraine without aura, not intractable, without status migrainosus: Secondary | ICD-10-CM | POA: Diagnosis not present

## 2018-10-18 DIAGNOSIS — M9902 Segmental and somatic dysfunction of thoracic region: Secondary | ICD-10-CM | POA: Diagnosis not present

## 2018-10-18 DIAGNOSIS — M9901 Segmental and somatic dysfunction of cervical region: Secondary | ICD-10-CM | POA: Diagnosis not present

## 2018-10-22 DIAGNOSIS — M40292 Other kyphosis, cervical region: Secondary | ICD-10-CM | POA: Diagnosis not present

## 2018-10-22 DIAGNOSIS — M9901 Segmental and somatic dysfunction of cervical region: Secondary | ICD-10-CM | POA: Diagnosis not present

## 2018-10-22 DIAGNOSIS — M9902 Segmental and somatic dysfunction of thoracic region: Secondary | ICD-10-CM | POA: Diagnosis not present

## 2018-10-22 DIAGNOSIS — G43009 Migraine without aura, not intractable, without status migrainosus: Secondary | ICD-10-CM | POA: Diagnosis not present

## 2018-10-25 DIAGNOSIS — M9901 Segmental and somatic dysfunction of cervical region: Secondary | ICD-10-CM | POA: Diagnosis not present

## 2018-10-25 DIAGNOSIS — M40292 Other kyphosis, cervical region: Secondary | ICD-10-CM | POA: Diagnosis not present

## 2018-10-25 DIAGNOSIS — G43009 Migraine without aura, not intractable, without status migrainosus: Secondary | ICD-10-CM | POA: Diagnosis not present

## 2018-10-25 DIAGNOSIS — M9902 Segmental and somatic dysfunction of thoracic region: Secondary | ICD-10-CM | POA: Diagnosis not present

## 2018-10-28 ENCOUNTER — Other Ambulatory Visit: Payer: Self-pay | Admitting: Neurology

## 2018-10-29 DIAGNOSIS — M9902 Segmental and somatic dysfunction of thoracic region: Secondary | ICD-10-CM | POA: Diagnosis not present

## 2018-10-29 DIAGNOSIS — M40292 Other kyphosis, cervical region: Secondary | ICD-10-CM | POA: Diagnosis not present

## 2018-10-29 DIAGNOSIS — G43009 Migraine without aura, not intractable, without status migrainosus: Secondary | ICD-10-CM | POA: Diagnosis not present

## 2018-10-29 DIAGNOSIS — M9901 Segmental and somatic dysfunction of cervical region: Secondary | ICD-10-CM | POA: Diagnosis not present

## 2018-11-01 ENCOUNTER — Other Ambulatory Visit: Payer: Self-pay | Admitting: Neurology

## 2018-11-01 MED ORDER — TIZANIDINE HCL 4 MG PO TABS: 4.0000 mg | ORAL_TABLET | Freq: Four times a day (QID) | ORAL | 11 refills | Status: AC | PRN

## 2018-11-01 MED ORDER — RIZATRIPTAN BENZOATE 10 MG PO TBDP: 10.0000 mg | ORAL_TABLET | ORAL | 11 refills | Status: DC | PRN

## 2018-11-14 ENCOUNTER — Encounter: Payer: Self-pay | Admitting: *Deleted

## 2019-01-08 ENCOUNTER — Other Ambulatory Visit: Payer: Self-pay

## 2019-01-08 DIAGNOSIS — Z20822 Contact with and (suspected) exposure to covid-19: Secondary | ICD-10-CM

## 2019-01-08 DIAGNOSIS — Z20828 Contact with and (suspected) exposure to other viral communicable diseases: Secondary | ICD-10-CM | POA: Diagnosis not present

## 2019-01-09 LAB — NOVEL CORONAVIRUS, NAA: SARS-CoV-2, NAA: NOT DETECTED

## 2019-04-01 ENCOUNTER — Encounter: Payer: Self-pay | Admitting: Neurology

## 2019-04-07 ENCOUNTER — Telehealth (INDEPENDENT_AMBULATORY_CARE_PROVIDER_SITE_OTHER): Payer: Medicare Other | Admitting: Family Medicine

## 2019-04-07 ENCOUNTER — Other Ambulatory Visit: Payer: Self-pay | Admitting: Neurology

## 2019-04-07 ENCOUNTER — Encounter: Payer: Self-pay | Admitting: Family Medicine

## 2019-04-07 DIAGNOSIS — G43109 Migraine with aura, not intractable, without status migrainosus: Secondary | ICD-10-CM | POA: Diagnosis not present

## 2019-04-07 DIAGNOSIS — G4730 Sleep apnea, unspecified: Secondary | ICD-10-CM | POA: Diagnosis not present

## 2019-04-07 NOTE — Progress Notes (Addendum)
PATIENT: Elizabeth Kennedy DOB: March 19, 1970  REASON FOR VISIT: follow up HISTORY FROM: patient  Virtual Visit via Telephone Note  I connected with Elizabeth Kennedy on 04/07/19 at  2:00 PM EST by telephone and verified that I am speaking with the correct person using two identifiers.   I discussed the limitations, risks, security and privacy concerns of performing an evaluation and management service by telephone and the availability of in person appointments. I also discussed with the patient that there may be a patient responsible charge related to this service. The patient expressed understanding and agreed to proceed.   History of Present Illness:  04/07/19 Elizabeth Kennedy is a 49 y.o. female here today for follow up for chronic migraines. She was seen as a new patient in 11/2017 by Elizabeth Kennedy, following three previous neurologists for chronic migraines. She had been on multiple preventative medications with little benefit or side effects. She has continued abortive therapy only since 06/2017 combined with chiropractic care. She has continued rizatriptan and tizanidine as needed which has been effective. She has noted an increase in intensity and frequency of migraines over the past 3 to 4 months. She wakes daily with a small tension type headache. She averages about 12 migraine days per month. She recently experienced a migraine that started with blurred vision, numbness, nausea then had severe pain about 40 minutes later that lasted about 36 hours. She has had similar headaches in the past but it had been a while. She waited to take abortive medications as the headache didn't start right away. She had to repeat rizatriptan and states that second dose helped. She describes having a "migraine hangover" for about 2 days. She felt better over the weekend but has had 1-2 tension type headaches. She did take Tylenol this morning with a headache. She does endorse weather changes as a  trigger.   She was diagnosed with mild OSA in 05/2018. Elizabeth Kennedy did not feel that severity of OSA was root cause for chronic migraines but offered to start auto pap therapy as patient was complaining of excessive fatigue as well. She did not respond due to concerns of pandemic. She does feel that she would like to try auto PAP therapy. She is a very restless sleeper and wakes up multiple times a night.    History (copied from Elizabeth Kennedy note on 12/10/2017)  HPI:  Elizabeth Kennedy is a 49 y.o. female here as requested by Elizabeth Kenner, DO for migraines.  Patient is new to me today that she was seen by my colleague Elizabeth Kennedy in July of this year and prior to that Elizabeth Kennedy who retired.  Past medical history migraines, anxiety, fibromyalgia, on disability due to chronic migraines history of PTSD. Her headaches may start with blurry vision sometimes tunnel vision lasting for 30 minutes but has had migraines without aura as well.  They are unilateral on the right. Severe pounding headache with associated light and noise sensitivity, nausea, movement makes it worse, sleep helps, she has intense migraine headaches with severe pain lasting for greater than 24 hours.  She says she is either going into a migraine headache, having a migraine headache or coming out of a migraine headache rarely has headache free days.  She is tried and failed multiple medications. She has failed multiple medications. She has had multiple side effects on medications and over the last 20 years she has been on multiple medications. She has daily headaches and at least  15 migraine days a month. No medication overuse. Does not treat migraines with acute medication more than 2-3x a week. Sleeping well.   Reviewed notes, labs and imaging from outside physicians, which showed:  Reviewed records from Elizabeth Kennedy my colleague who previously saw patient.  Patient stopped taking only daily preventative medications in May 2019.  When she  was seen in July she was experiencing 15 headache days a month.  No medication overuse.  She started going to a chiropractor.  Prior to that she had a history of migraine headaches for many years.  She has a history of migraines for 20 years previously under the care of Elizabeth Kennedy since 2018 at and neurologist Elizabeth Kennedy for 14 years prior to that.  Her most recent visit with Elizabeth Kennedy she was given a lidocaine nose spray and Imitrex for acute management.  List of prior medications include Neurontin, doxepin, amitriptyline, Lamictal, nortriptyline, Tylenol, Effexor, Topamax, verapamil and Keppra.  And per notes, MRI of the brain without contrast in November 2016 and MRI of the brain without contrast 5-year 2016 were unremarkable.   At Elizabeth Kennedy appointment she was started on Effexor.    Observations/Objective:  Generalized: Well developed, in no acute distress  Mentation: Alert oriented to time, place, history taking. Follows all commands speech and language fluent   Assessment and Plan:  49 y.o. year old female  has a past medical history of Arthritis, Fibromyalgia, History of esophageal dilatation, History of exercise stress test (03-01-2010   Elizabeth hochrein), IBS (irritable bowel syndrome), Iron deficiency anemia, Menorrhagia, Migraines, Seasonal allergies, and Wears glasses. here with    ICD-10-CM   1. Chronic migraine with aura  G43.109   2. Mild sleep apnea  G47.30    Elizabeth Kennedy has noticed an increase in both intensity and frequency of headaches and migraine days.  Unfortunately, she has been unable to continue chiropractic care due to pandemic.  She has been on multiple preventative medications in the past that have either been ineffective or caused side effects.  She was doing well with Botox therapy but stopped due to the pandemic.  We have discussed multiple options for treating both headaches and obstructive sleep apnea.  She endorses frequent morning headaches.  She is a restless  sleeper and wakes multiple times a night.  I feel that she will definitely benefit from a trial of AutoPap therapy.  I will ask that orders be resent for initiating CPAP therapy.  She is aware that any company will be reaching out to her to help her set up and start therapy.  She will continue rizatriptan and tizanidine as needed for abortive therapy.  We will consider resuming Botox therapy following initial CPAP review in office in about 45 days.  She was advised to stay well-hydrated, work on a well-balanced diet and try to get in regular exercise.  She will reach out for any worsening concerns.  She verbalizes understanding and agreement with this plan.   No orders of the defined types were placed in this encounter.   No orders of the defined types were placed in this encounter.    Follow Up Instructions:  I discussed the assessment and treatment plan with the patient. The patient was provided an opportunity to ask questions and all were answered. The patient agreed with the plan and demonstrated an understanding of the instructions.   The patient was advised to call back or seek an in-person evaluation if the symptoms worsen or if  the condition fails to improve as anticipated.  I provided 25 minutes of non-face-to-face time during this encounter.  Patient is located at her place of residence during my chart visit.  Provider is located in the office.   Debbora Presto, NP   Made any corrections needed, and agree with history, physical, neuro exam,assessment and plan as stated.     Sarina Ill, MD [facility]

## 2019-06-12 DIAGNOSIS — M9905 Segmental and somatic dysfunction of pelvic region: Secondary | ICD-10-CM | POA: Diagnosis not present

## 2019-06-12 DIAGNOSIS — M9902 Segmental and somatic dysfunction of thoracic region: Secondary | ICD-10-CM | POA: Diagnosis not present

## 2019-06-12 DIAGNOSIS — M9901 Segmental and somatic dysfunction of cervical region: Secondary | ICD-10-CM | POA: Diagnosis not present

## 2019-06-12 DIAGNOSIS — M9903 Segmental and somatic dysfunction of lumbar region: Secondary | ICD-10-CM | POA: Diagnosis not present

## 2019-06-13 DIAGNOSIS — G43009 Migraine without aura, not intractable, without status migrainosus: Secondary | ICD-10-CM | POA: Diagnosis not present

## 2019-06-13 DIAGNOSIS — M40292 Other kyphosis, cervical region: Secondary | ICD-10-CM | POA: Diagnosis not present

## 2019-06-13 DIAGNOSIS — M9901 Segmental and somatic dysfunction of cervical region: Secondary | ICD-10-CM | POA: Diagnosis not present

## 2019-06-13 DIAGNOSIS — M9902 Segmental and somatic dysfunction of thoracic region: Secondary | ICD-10-CM | POA: Diagnosis not present

## 2019-06-16 DIAGNOSIS — M9901 Segmental and somatic dysfunction of cervical region: Secondary | ICD-10-CM | POA: Diagnosis not present

## 2019-06-16 DIAGNOSIS — G43009 Migraine without aura, not intractable, without status migrainosus: Secondary | ICD-10-CM | POA: Diagnosis not present

## 2019-06-16 DIAGNOSIS — M9902 Segmental and somatic dysfunction of thoracic region: Secondary | ICD-10-CM | POA: Diagnosis not present

## 2019-06-16 DIAGNOSIS — M40292 Other kyphosis, cervical region: Secondary | ICD-10-CM | POA: Diagnosis not present

## 2019-06-18 DIAGNOSIS — M9902 Segmental and somatic dysfunction of thoracic region: Secondary | ICD-10-CM | POA: Diagnosis not present

## 2019-06-18 DIAGNOSIS — M40292 Other kyphosis, cervical region: Secondary | ICD-10-CM | POA: Diagnosis not present

## 2019-06-18 DIAGNOSIS — G43009 Migraine without aura, not intractable, without status migrainosus: Secondary | ICD-10-CM | POA: Diagnosis not present

## 2019-06-18 DIAGNOSIS — M9901 Segmental and somatic dysfunction of cervical region: Secondary | ICD-10-CM | POA: Diagnosis not present

## 2019-06-20 DIAGNOSIS — G43009 Migraine without aura, not intractable, without status migrainosus: Secondary | ICD-10-CM | POA: Diagnosis not present

## 2019-06-20 DIAGNOSIS — M9902 Segmental and somatic dysfunction of thoracic region: Secondary | ICD-10-CM | POA: Diagnosis not present

## 2019-06-20 DIAGNOSIS — M9901 Segmental and somatic dysfunction of cervical region: Secondary | ICD-10-CM | POA: Diagnosis not present

## 2019-06-20 DIAGNOSIS — M40292 Other kyphosis, cervical region: Secondary | ICD-10-CM | POA: Diagnosis not present

## 2019-06-23 DIAGNOSIS — M9902 Segmental and somatic dysfunction of thoracic region: Secondary | ICD-10-CM | POA: Diagnosis not present

## 2019-06-23 DIAGNOSIS — M9901 Segmental and somatic dysfunction of cervical region: Secondary | ICD-10-CM | POA: Diagnosis not present

## 2019-06-23 DIAGNOSIS — M40292 Other kyphosis, cervical region: Secondary | ICD-10-CM | POA: Diagnosis not present

## 2019-06-23 DIAGNOSIS — G43009 Migraine without aura, not intractable, without status migrainosus: Secondary | ICD-10-CM | POA: Diagnosis not present

## 2019-06-25 DIAGNOSIS — M9902 Segmental and somatic dysfunction of thoracic region: Secondary | ICD-10-CM | POA: Diagnosis not present

## 2019-06-25 DIAGNOSIS — M40292 Other kyphosis, cervical region: Secondary | ICD-10-CM | POA: Diagnosis not present

## 2019-06-25 DIAGNOSIS — G43009 Migraine without aura, not intractable, without status migrainosus: Secondary | ICD-10-CM | POA: Diagnosis not present

## 2019-06-25 DIAGNOSIS — M9901 Segmental and somatic dysfunction of cervical region: Secondary | ICD-10-CM | POA: Diagnosis not present

## 2019-06-27 DIAGNOSIS — M40292 Other kyphosis, cervical region: Secondary | ICD-10-CM | POA: Diagnosis not present

## 2019-06-27 DIAGNOSIS — M9901 Segmental and somatic dysfunction of cervical region: Secondary | ICD-10-CM | POA: Diagnosis not present

## 2019-06-27 DIAGNOSIS — M9902 Segmental and somatic dysfunction of thoracic region: Secondary | ICD-10-CM | POA: Diagnosis not present

## 2019-06-27 DIAGNOSIS — G43009 Migraine without aura, not intractable, without status migrainosus: Secondary | ICD-10-CM | POA: Diagnosis not present

## 2019-06-30 DIAGNOSIS — M9902 Segmental and somatic dysfunction of thoracic region: Secondary | ICD-10-CM | POA: Diagnosis not present

## 2019-06-30 DIAGNOSIS — G43009 Migraine without aura, not intractable, without status migrainosus: Secondary | ICD-10-CM | POA: Diagnosis not present

## 2019-06-30 DIAGNOSIS — M9901 Segmental and somatic dysfunction of cervical region: Secondary | ICD-10-CM | POA: Diagnosis not present

## 2019-06-30 DIAGNOSIS — M40292 Other kyphosis, cervical region: Secondary | ICD-10-CM | POA: Diagnosis not present

## 2019-07-02 DIAGNOSIS — M9902 Segmental and somatic dysfunction of thoracic region: Secondary | ICD-10-CM | POA: Diagnosis not present

## 2019-07-02 DIAGNOSIS — G43009 Migraine without aura, not intractable, without status migrainosus: Secondary | ICD-10-CM | POA: Diagnosis not present

## 2019-07-02 DIAGNOSIS — M40292 Other kyphosis, cervical region: Secondary | ICD-10-CM | POA: Diagnosis not present

## 2019-07-02 DIAGNOSIS — M9901 Segmental and somatic dysfunction of cervical region: Secondary | ICD-10-CM | POA: Diagnosis not present

## 2019-07-04 DIAGNOSIS — G43009 Migraine without aura, not intractable, without status migrainosus: Secondary | ICD-10-CM | POA: Diagnosis not present

## 2019-07-04 DIAGNOSIS — M40292 Other kyphosis, cervical region: Secondary | ICD-10-CM | POA: Diagnosis not present

## 2019-07-04 DIAGNOSIS — M9902 Segmental and somatic dysfunction of thoracic region: Secondary | ICD-10-CM | POA: Diagnosis not present

## 2019-07-04 DIAGNOSIS — M9901 Segmental and somatic dysfunction of cervical region: Secondary | ICD-10-CM | POA: Diagnosis not present

## 2019-07-07 DIAGNOSIS — G43009 Migraine without aura, not intractable, without status migrainosus: Secondary | ICD-10-CM | POA: Diagnosis not present

## 2019-07-07 DIAGNOSIS — M9902 Segmental and somatic dysfunction of thoracic region: Secondary | ICD-10-CM | POA: Diagnosis not present

## 2019-07-07 DIAGNOSIS — M40292 Other kyphosis, cervical region: Secondary | ICD-10-CM | POA: Diagnosis not present

## 2019-07-07 DIAGNOSIS — M9901 Segmental and somatic dysfunction of cervical region: Secondary | ICD-10-CM | POA: Diagnosis not present

## 2019-07-09 DIAGNOSIS — M9902 Segmental and somatic dysfunction of thoracic region: Secondary | ICD-10-CM | POA: Diagnosis not present

## 2019-07-09 DIAGNOSIS — G43009 Migraine without aura, not intractable, without status migrainosus: Secondary | ICD-10-CM | POA: Diagnosis not present

## 2019-07-09 DIAGNOSIS — M40292 Other kyphosis, cervical region: Secondary | ICD-10-CM | POA: Diagnosis not present

## 2019-07-09 DIAGNOSIS — M9901 Segmental and somatic dysfunction of cervical region: Secondary | ICD-10-CM | POA: Diagnosis not present

## 2019-08-07 ENCOUNTER — Encounter: Payer: Self-pay | Admitting: Family Medicine

## 2019-08-07 NOTE — Telephone Encounter (Signed)
LVM for pt offering appt times.

## 2019-08-07 NOTE — Telephone Encounter (Signed)
cpap report pulled from [facility]. Set-up date 05/08/19.

## 2019-08-12 ENCOUNTER — Encounter: Payer: Self-pay | Admitting: Family Medicine

## 2019-08-12 ENCOUNTER — Telehealth (INDEPENDENT_AMBULATORY_CARE_PROVIDER_SITE_OTHER): Payer: Medicare Other | Admitting: Family Medicine

## 2019-08-12 DIAGNOSIS — Z9989 Dependence on other enabling machines and devices: Secondary | ICD-10-CM

## 2019-08-12 DIAGNOSIS — G43711 Chronic migraine without aura, intractable, with status migrainosus: Secondary | ICD-10-CM | POA: Diagnosis not present

## 2019-08-12 DIAGNOSIS — G4733 Obstructive sleep apnea (adult) (pediatric): Secondary | ICD-10-CM | POA: Diagnosis not present

## 2019-08-12 NOTE — Progress Notes (Addendum)
PATIENT: Elizabeth Kennedy DOB: 11-25-1970  REASON FOR VISIT: follow up HISTORY FROM: patient  Virtual Visit via Telephone Note  I connected with [name] on 08/12/19 at  1:30 PM EDT by telephone and verified that I am speaking with the correct person using two identifiers.   I discussed the limitations, risks, security and privacy concerns of performing an evaluation and management service by telephone and the availability of in person appointments. I also discussed with the patient that there may be a patient responsible charge related to this service. The patient expressed understanding and agreed to proceed.   History of Present Illness:  08/12/19 Elizabeth Kennedy is a 49 y.o. female here today for follow up for chronic migraines and OSA on CPAP. She reports some improvement since starting CPAP therapy. She definitely notes sleep quality has improved. She feels that memory is sharper. She is trying to use CPAP daily but is having about 9-10 migrainous headaches each month. She is unable to tolerate CPAP mask when she has a migraine. She does feel that rizatriptan helps with abortive therapy. She has resumed chiropractic care. She is getting massages as well that help.  She has tried and failed multiple preventative and abortive medications in the past.  Botox has seemed to be most effective for her with the least amount of side effects.  She wishes to resume Botox therapy.  Compliance report dated 05/13/2019 through 08/10/2019 reveals that she used CPAP 59 of the past 90 days for compliance of 66%.  She used CPAP greater than 4 hours 46 of the past 90 days for compliance of 51%.  Average usage was 5 hours and 5 minutes.  Residual AHI was 2.6 on 5 to 12 cm of water and an EPR of 3.  There was a leak noted in the 95th percentile of 33.1.  Leak has been much better over the past 30 days.  List of prior medications include Neurontin, doxepin, amitriptyline, Lamictal,  nortriptyline, Tylenol, Effexor, Topamax, verapamil and Keppra. Imitrex, lidocaine nasal spray and rizatriptan used for abortive therapy.   History (copied from my note on 04/07/2019)  Elizabeth Kennedy is a 49 y.o. female here today for follow up for chronic migraines. She was seen as a new patient in 11/2017 by Dr Jaynee Eagles, following three previous neurologists for chronic migraines. She had been on multiple preventative medications with little benefit or side effects. She has continued abortive therapy only since 06/2017 combined with chiropractic care. She has continued rizatriptan and tizanidine as needed which has been effective. She has noted an increase in intensity and frequency of migraines over the past 3 to 4 months. She wakes daily with a small tension type headache. She averages about 12 migraine days per month. She recently experienced a migraine that started with blurred vision, numbness, nausea then had severe pain about 40 minutes later that lasted about 36 hours. She has had similar headaches in the past but it had been a while. She waited to take abortive medications as the headache didn't start right away. She had to repeat rizatriptan and states that second dose helped. She describes having a "migraine hangover" for about 2 days. She felt better over the weekend but has had 1-2 tension type headaches. She did take Tylenol this morning with a headache. She does endorse weather changes as a trigger.   She was diagnosed with mild OSA in 05/2018. Dr Brett Fairy did not feel that severity of OSA was root cause  for chronic migraines but offered to start auto pap therapy as patient was complaining of excessive fatigue as well. She did not respond due to concerns of pandemic. She does feel that she would like to try auto PAP therapy. She is a very restless sleeper and wakes up multiple times a night.    History (copied from Dr Cathren Laine note on 12/10/2017)  [id number] Elizabeth Kennedy a 49  y.o.femalehere as requested by [name], DOfor migraines.Patient is new to me today that she was seen by my colleague Dr. [name] in July of this year and prior to that Dr. [name] who retired. Past medical history migraines, anxiety, fibromyalgia, on disability due to chronic migraines history of PTSD. Her headaches maystart with blurry vision sometimes tunnel vision lasting for 30 minutes but has had migraines without aura as well. They are unilateral on the right. Severe pounding headache with associated light and noise sensitivity, nausea, movement makes it worse, sleep helps, she has intense migraine headaches with severe pain lasting for greater than 24 hours. She says she is either going into a migraine headache, having a migraine headache or coming out of a migraine headache rarely has headache free days. She is tried and failed multiple medications. She has failed multiple medications. She has had multiple side effects on medications and over the last 20 years she has been on multiple medications. She has daily headaches and at least 15 migraine days a month. No medication overuse. Does not treat migraines with acute medication more than 2-3x a week. Sleeping well.  Reviewed notes, labs and imaging from outside physicians, which showed:  Reviewed records from Dr. [name] my colleague who previously saw patient. Patient stopped taking only daily preventative medications in May 2019. When she was seen in July she was experiencing 15 headache days a month. No medication overuse. She started going to a chiropractor. Prior to that she had a history of migraine headaches for many years. She has a history of migraines for 20 years previously under the care of Dr. Elmore Guise 2018 at and neurologist Dr. Suanne Marker for 14 years prior to that. Her most recent visit with Dr. [name] she was given a lidocaine nose spray and Imitrex for acute management. List of prior medications include  Neurontin, doxepin, amitriptyline, Lamictal, nortriptyline, Tylenol, Effexor, Topamax, verapamil and Keppra. And per notes, MRI of the brain without contrast in November 2016 and MRI of the brain without contrast 5-year 2016 were unremarkable.   At Dr. Rhea Belton appointment she was started on Effexor.  Observations/Objective:  Generalized: Well developed, in no acute distress  Mentation: Alert oriented to time, place, history taking. Follows all commands speech and language fluent   Assessment and Plan:  49 y.o. year old female  has a past medical history of Arthritis, Fibromyalgia, History of esophageal dilatation, History of exercise stress test (03-01-2010   dr hochrein), IBS (irritable bowel syndrome), Iron deficiency anemia, Menorrhagia, Migraines, Seasonal allergies, and Wears glasses. here with    ICD-10-CM   1. Chronic migraine without aura, with intractable migraine, so stated, with status migrainosus  G43.711   2. OSA on CPAP  G47.33    Z99.89     Margarita Grizzle is doing better today.  She does note improvement in sleep quality and a sharper memory with CPAP usage.  I have encouraged her to continue using CPAP nightly and for greater than 4 hours each night.  She does continue to have at least 9-10 migrainous headaches every month.  She may  have 15-20 headache days per month.  She has tried and failed multiple preventatives in the past.  She is requesting to restart Botox therapy.  We will place orders today and will initiate therapy pending insurance approval.  She was encouraged to continue using rizatriptan as needed for abortive therapy.  She has identified certain foods as triggers and was encouraged to avoid dairy, processed foods and simple carbohydrates.  Adequate hydration encouraged.  She will continue chiropractic and massage therapy.  She will follow-up with me in 3 to 6 months for review of compliance download as well as follow-up with migraines.  She verbalizes understanding and  agreement with this plan.  No orders of the defined types were placed in this encounter.   No orders of the defined types were placed in this encounter.    Follow Up Instructions:  I discussed the assessment and treatment plan with the patient. The patient was provided an opportunity to ask questions and all were answered. The patient agreed with the plan and demonstrated an understanding of the instructions.   The patient was advised to call back or seek an in-person evaluation if the symptoms worsen or if the condition fails to improve as anticipated.  I provided 15 minutes of non-face-to-face time during this encounter.  Patient is located at her place of residence during my chart visit.  Providers in the office.   Debbora Presto, NP  Made any corrections needed, and agree with history, physical, neuro exam,assessment and plan as stated.     Sarina Ill, MD [facility]

## 2019-08-15 ENCOUNTER — Ambulatory Visit (INDEPENDENT_AMBULATORY_CARE_PROVIDER_SITE_OTHER)
Admission: RE | Admit: 2019-08-15 | Discharge: 2019-08-15 | Disposition: A | Discharge status: Home / Self Care | Payer: Medicare Other | Source: Ambulatory Visit

## 2019-08-15 DIAGNOSIS — B349 Viral infection, unspecified: Secondary | ICD-10-CM | POA: Diagnosis not present

## 2019-08-15 NOTE — Discharge Instructions (Signed)
Take Tylenol or ibuprofen as needed for fever or discomfort.  Take over-the-counter plain Mucinex as needed for congestion.    Follow up with your primary care provider or come here to be seen in person if your symptoms are not improving.

## 2019-08-15 NOTE — ED Provider Notes (Signed)
Virtual Visit via Video Note:  Elizabeth Kennedy  initiated request for Telemedicine visit with [facility] team. I connected with Elizabeth Kennedy  on 08/15/2019 at 3:43 PM  for a synchronized telemedicine visit using a video enabled HIPPA compliant telemedicine application. I verified that I am speaking with Elizabeth Kennedy  using two identifiers. Elizabeth Balloon, NP  was physically located in a [facility] Urgent care site and Shabree Tebbetts was located at a different location.   The limitations of evaluation and management by telemedicine as well as the availability of in-person appointments were discussed. Patient was informed that she  may incur a bill ( including co-pay) for this virtual visit encounter. Elizabeth Kennedy  expressed understanding and gave verbal consent to proceed with virtual visit.     History of Present Illness:Elizabeth Kennedy  is a 49 y.o. female presents for evaluation of fever, sinus congestion, postnasal drip, sore throat, nonproductive cough x 2 days.  Tmax 101.8.  She also reports 2 episodes of diarrhea this week but other bowel movements have been normal.  She denies rash, shortness of breath, abdominal pain, emesis, or other symptoms.  She had a negative COVID test today at [facility] and has had both COVID vaccines.  She has been treating her symptoms at home with Nyquil Cold & Flu.     Allergies  Allergen Reactions  . Gluten Meal   . Morphine And Related Nausea And Vomiting  . Tape Hives    Use paper tape     Past Medical History:  Diagnosis Date  . Arthritis   . Fibromyalgia    per pt has left hand mild numbness/ weakness and left foot due to fibromyalgia  . History of esophageal dilatation    per pt x5  last one 2012  approx.  Marland Kitchen History of exercise stress test 03-01-2010   dr hochrein   no evidence ischemia/  also Stress Echo test done-- with stress there was a thickening and contractility in all segments,  there is decrease in cavity size in all views; this is a normal stress echo study;  normal LV global function and wall motion, ef 555%  . IBS (irritable bowel syndrome)    diet controlled. no meds  . Iron deficiency anemia   . Menorrhagia   . Migraines   . Seasonal allergies   . Wears glasses      Social History   Tobacco Use  . Smoking status: Never Smoker  . Smokeless tobacco: Never Used  Vaping Use  . Vaping Use: Never used  Substance Use Topics  . Alcohol use: No    Comment: no use since 2012  . Drug use: No    ROS: as stated in HPI.  All other systems reviewed and negative.     Observations/Objective: Physical Exam  VITALS: Patient denies fever. GENERAL: Alert, appears well and in no acute distress. HEENT: Atraumatic. Oral mucosa appears moist. NECK: Normal movements of the head and neck. CARDIOPULMONARY: No increased WOB. Speaking in clear sentences. I:E ratio WNL.  MS: Moves all visible extremities without noticeable abnormality. PSYCH: Pleasant and cooperative, well-groomed. Speech normal rate and rhythm. Affect is appropriate. Insight and judgement are appropriate. Attention is focused, linear, and appropriate.  NEURO: CN grossly intact. Oriented as arrived to appointment on time with no prompting. Moves both UE equally.  SKIN: No obvious lesions, wounds, erythema, or cyanosis noted on face or hands.   Assessment and Plan:  ICD-10-CM   1. Viral illness  B34.9        Follow Up Instructions: Instructed patient to take Tylenol or ibuprofen as needed for fever or discomfort; Mucinex as needed for congestion.  Discussed limitations of video visit for evaluation of a cough or sore throat.  Directed her to follow-up with her PCP or come here to be seen in person if her symptoms are not improving.  Patient agrees to plan of care.    I discussed the assessment and treatment plan with the patient. The patient was provided an opportunity to ask questions and all were  answered. The patient agreed with the plan and demonstrated an understanding of the instructions.   The patient was advised to call back or seek an in-person evaluation if the symptoms worsen or if the condition fails to improve as anticipated.      Elizabeth Balloon, NP  08/15/2019 3:43 PM         Elizabeth Balloon, NP 08/15/19 [phone]

## 2019-08-19 ENCOUNTER — Telehealth: Payer: Self-pay | Admitting: Family Medicine

## 2019-08-19 NOTE — Telephone Encounter (Signed)
-----   Message from Debbora Presto, NP sent at 08/14/2019  2:55 PM EDT -----  ----- Message ----- From: Brandon Melnick, RN Sent: 08/12/2019   3:11 PM EDT To: Debbora Presto, NP  Lovena Le can you reach out, thanks

## 2019-08-19 NOTE — Telephone Encounter (Signed)
Thank you, when pt calls back it is okay to schedule with amy

## 2019-08-19 NOTE — Telephone Encounter (Signed)
Patient is wishing to restart Botox therapy. I called patient and LVM asking her to call me back to verify that Medicare A & B is her only insurance. If this is the case, we can restart Botox therapy at next available appointment with either Amy or Dr. Jaynee Eagles. FYI

## 2019-08-19 NOTE — Telephone Encounter (Signed)
Can call to confirm coverage and schedule with first available.

## 2019-08-20 ENCOUNTER — Ambulatory Visit (INDEPENDENT_AMBULATORY_CARE_PROVIDER_SITE_OTHER): Payer: Medicare Other

## 2019-08-20 ENCOUNTER — Other Ambulatory Visit: Payer: Self-pay

## 2019-08-20 ENCOUNTER — Encounter (HOSPITAL_COMMUNITY): Payer: Self-pay

## 2019-08-20 ENCOUNTER — Ambulatory Visit (HOSPITAL_COMMUNITY)
Admission: RE | Admit: 2019-08-20 | Discharge: 2019-08-20 | Disposition: A | Discharge status: Home / Self Care | Payer: Medicare Other | Source: Ambulatory Visit | Attending: Family Medicine | Admitting: Family Medicine

## 2019-08-20 VITALS — BP 127/61 | HR 76 | Temp 99.3°F | Resp 16

## 2019-08-20 DIAGNOSIS — R5381 Other malaise: Secondary | ICD-10-CM

## 2019-08-20 DIAGNOSIS — R05 Cough: Secondary | ICD-10-CM

## 2019-08-20 DIAGNOSIS — R079 Chest pain, unspecified: Secondary | ICD-10-CM

## 2019-08-20 DIAGNOSIS — R0789 Other chest pain: Secondary | ICD-10-CM

## 2019-08-20 DIAGNOSIS — J069 Acute upper respiratory infection, unspecified: Secondary | ICD-10-CM

## 2019-08-20 DIAGNOSIS — R059 Cough, unspecified: Secondary | ICD-10-CM

## 2019-08-20 MED ORDER — PROMETHAZINE-DM 6.25-15 MG/5ML PO SYRP: 5.0000 mL | ORAL_SOLUTION | Freq: Every evening | ORAL | 0 refills | Status: DC | PRN

## 2019-08-20 MED ORDER — PREDNISONE 20 MG PO TABS: ORAL_TABLET | ORAL | 0 refills | Status: DC

## 2019-08-20 MED ORDER — BENZONATATE 100 MG PO CAPS: 100.0000 mg | ORAL_CAPSULE | Freq: Three times a day (TID) | ORAL | 0 refills | Status: DC | PRN

## 2019-08-20 NOTE — ED Triage Notes (Addendum)
One week ago started  having symptoms, a runny nose and fever.  Saturday, runny nose went away, cough moved into chest.  Pain in left side.  Chest tightness, congestion in back of throat and fever worsens at night.    Patient had a covid test on Friday-negative

## 2019-08-20 NOTE — Telephone Encounter (Signed)
Patient returned my call. I scheduled her with first available with Dr. Jaynee Eagles on 8/31. I let her know that I would be keeping an eye on the schedule for any cancellations. She verbalized understanding and appreciation.

## 2019-08-20 NOTE — ED Provider Notes (Signed)
[place]   MRN: [id number] DOB: 1970-08-16  Subjective:   Elizabeth Kennedy is a 49 y.o. female presenting for 1 week history of persistent and worsening malaise.  Symptoms started out with fever, sinus congestion/runny nose.  Symptoms of congestion runny nose had improved over the weekend but cough worsened and is now having chest pain.  She feels chest congestion and throat congestion, chest tightness, fevers are worse at night.  Had COVID vaccination in April. Had negative COVID test ~5 days ago.  Denies history of smoking, asthma, COPD.  No current facility-administered medications for this encounter.  Current Outpatient Medications:  .  Ferrous Sulfate (IRON) 325 (65 Fe) MG TABS, Take 1 tablet daily by mouth., Disp: , Rfl:  .  Probiotic Product (PROBIOTIC DAILY) CAPS, Take 1 capsule daily by mouth., Disp: , Rfl:  .  tiZANidine (ZANAFLEX) 4 MG tablet, Take 1 tablet (4 mg total) by mouth every 6 (six) hours as needed for muscle spasms., Disp: 30 tablet, Rfl: 11 .  acetaminophen (TYLENOL) 500 MG tablet, Take 500 mg every 6 (six) hours as needed by mouth., Disp: , Rfl:  .  promethazine (PHENERGAN) 25 MG tablet, Take 25 mg every 6 (six) hours as needed by mouth for nausea or vomiting., Disp: , Rfl:  .  rizatriptan (MAXALT-MLT) 10 MG disintegrating tablet, Take 1 tablet (10 mg total) by mouth as needed. May repeat in 2 hours if needed, Disp: 12 tablet, Rfl: 11   Allergies  Allergen Reactions  . Gluten Meal   . Morphine And Related Nausea And Vomiting  . Tape Hives    Use paper tape    Past Medical History:  Diagnosis Date  . Arthritis   . Fibromyalgia    per pt has left hand mild numbness/ weakness and left foot due to fibromyalgia  . History of esophageal dilatation    per pt x5  last one 2012  approx.  Marland Kitchen History of exercise stress test 03-01-2010   dr hochrein   no evidence ischemia/  also Stress Echo test done-- with stress there was a thickening and  contractility in all segments, there is decrease in cavity size in all views; this is a normal stress echo study;  normal LV global function and wall motion, ef 555%  . IBS (irritable bowel syndrome)    diet controlled. no meds  . Iron deficiency anemia   . Menorrhagia   . Migraines   . Seasonal allergies   . Wears glasses      Past Surgical History:  Procedure Laterality Date  . DILATATION & CURETTAGE/HYSTEROSCOPY WITH MYOSURE N/A 06/21/2015   Procedure: DILATATION & CURETTAGE/HYSTEROSCOPY WITH MYOSURE;  Surgeon: [name], DO;  Location: [facility];  Service: Gynecology;  Laterality: N/A;  . DILITATION & CURRETTAGE/HYSTROSCOPY WITH NOVASURE ABLATION N/A 01/10/2017   Procedure: DILATATION & CURETTAGE/HYSTEROSCOPY WITH NOVASURE ABLATION;  Surgeon: [name], DO;  Location: [facility];  Service: Gynecology;  Laterality: N/A;  . LAPAROSCOPIC CHOLECYSTECTOMY  2008  . MASS EXCISION Right 11/22/2012   Procedure: EXCISION RIGHT GROIN MASS ;  Surgeon: Harl Bowie, MD;  Location: [facility];  Service: General;  Laterality: Right;  . TUBAL LIGATION Bilateral 1995   PPTL  . UPPER GI ENDOSCOPY  x5  last one 2012  approx.   w/ dilations    Family History  Problem Relation Age of Onset  . Diabetes Father   . Kidney disease Father   . Aneurysm Sister  Social History   Tobacco Use  . Smoking status: Never Smoker  . Smokeless tobacco: Never Used  Vaping Use  . Vaping Use: Never used  Substance Use Topics  . Alcohol use: No    Comment: no use since 2012  . Drug use: No    ROS   Objective:   Vitals: BP 127/61 (BP Location: Left Arm)   Pulse 76   Temp 99.3 F (37.4 C) (Oral)   Resp 16   LMP 08/07/2019   SpO2 100%   Physical Exam Constitutional:      General: She is not in acute distress.    Appearance: Normal appearance. She is well-developed. She is not ill-appearing, toxic-appearing or diaphoretic.  HENT:     Head: Normocephalic and  atraumatic.     Nose: Nose normal.     Mouth/Throat:     Mouth: Mucous membranes are moist.  Eyes:     Extraocular Movements: Extraocular movements intact.     Pupils: Pupils are equal, round, and reactive to light.  Cardiovascular:     Rate and Rhythm: Normal rate and regular rhythm.     Pulses: Normal pulses.     Heart sounds: Normal heart sounds. No murmur heard.  No friction rub. No gallop.   Pulmonary:     Effort: Pulmonary effort is normal. No respiratory distress.     Breath sounds: Normal breath sounds. No stridor. No wheezing, rhonchi or rales.  Skin:    General: Skin is warm and dry.     Findings: No rash.  Neurological:     Mental Status: She is alert and oriented to person, place, and time.  Psychiatric:        Mood and Affect: Mood normal.        Behavior: Behavior normal.        Thought Content: Thought content normal.        Judgment: Judgment normal.     DG Chest 2 View  Result Date: 08/20/2019 CLINICAL DATA:  Chest pain and cough EXAM: CHEST - 2 VIEW COMPARISON:  05/04/2015 FINDINGS: The heart size and mediastinal contours are within normal limits. Both lungs are clear. The visualized skeletal structures are unremarkable. IMPRESSION: No active cardiopulmonary disease. Electronically Signed   By: Donavan Foil M.D.   On: 08/20/2019 18:34    Assessment and Plan :   PDMP not reviewed this encounter.  1. Cough   2. Atypical chest pain   3. Malaise   4. Viral URI with cough     We will trial a prednisone course to help with patient's respiratory symptoms.  She has done well with this in the past.  Use supportive care otherwise.  Deferred COVID-19 testing due to recent test and her vaccination. Counseled patient on potential for adverse effects with medications prescribed/recommended today, ER and return-to-clinic precautions discussed, patient verbalized understanding.    Jaynee Eagles, [place] 08/20/19 [id number]

## 2019-09-05 DIAGNOSIS — Z124 Encounter for screening for malignant neoplasm of cervix: Secondary | ICD-10-CM | POA: Diagnosis not present

## 2019-09-05 DIAGNOSIS — Z01419 Encounter for gynecological examination (general) (routine) without abnormal findings: Secondary | ICD-10-CM | POA: Diagnosis not present

## 2019-09-05 DIAGNOSIS — Z833 Family history of diabetes mellitus: Secondary | ICD-10-CM | POA: Diagnosis not present

## 2019-09-05 DIAGNOSIS — Z1151 Encounter for screening for human papillomavirus (HPV): Secondary | ICD-10-CM | POA: Diagnosis not present

## 2019-09-09 DIAGNOSIS — Z1231 Encounter for screening mammogram for malignant neoplasm of breast: Secondary | ICD-10-CM | POA: Diagnosis not present

## 2019-09-11 ENCOUNTER — Other Ambulatory Visit: Payer: Self-pay | Admitting: Obstetrics and Gynecology

## 2019-09-11 DIAGNOSIS — R928 Other abnormal and inconclusive findings on diagnostic imaging of breast: Secondary | ICD-10-CM

## 2019-10-02 DIAGNOSIS — K900 Celiac disease: Secondary | ICD-10-CM | POA: Diagnosis not present

## 2019-10-02 DIAGNOSIS — K224 Dyskinesia of esophagus: Secondary | ICD-10-CM | POA: Diagnosis not present

## 2019-10-02 DIAGNOSIS — Z1211 Encounter for screening for malignant neoplasm of colon: Secondary | ICD-10-CM | POA: Diagnosis not present

## 2019-10-03 ENCOUNTER — Other Ambulatory Visit: Payer: Self-pay

## 2019-10-03 ENCOUNTER — Ambulatory Visit
Admission: RE | Admit: 2019-10-03 | Discharge: 2019-10-03 | Disposition: A | Discharge status: Home / Self Care | Payer: Medicare Other | Source: Ambulatory Visit | Attending: Obstetrics and Gynecology | Admitting: Obstetrics and Gynecology

## 2019-10-03 ENCOUNTER — Other Ambulatory Visit: Payer: Self-pay | Admitting: Obstetrics and Gynecology

## 2019-10-03 DIAGNOSIS — R928 Other abnormal and inconclusive findings on diagnostic imaging of breast: Secondary | ICD-10-CM

## 2019-10-03 DIAGNOSIS — N6489 Other specified disorders of breast: Secondary | ICD-10-CM | POA: Diagnosis not present

## 2019-10-03 DIAGNOSIS — R922 Inconclusive mammogram: Secondary | ICD-10-CM | POA: Diagnosis not present

## 2019-10-13 NOTE — Progress Notes (Signed)
Consent Form Botulism Toxin Injection For Chronic Migraine  10/14/2019: Her baseline is daily headaches and at least 15 migraine days a month at baseline. She had a great repsonse to her one botox last year but she was too scared to do it again bc of covid so starting again. +masseters and temples. She has a heavy brow if she noticed some heaviness at next appointment do the forehead injections closer tot he hairline.  Reviewed orally with patient, additionally signature is on file:  Botulism toxin has been approved by the Federal drug administration for treatment of chronic migraine. Botulism toxin does not cure chronic migraine and it may not be effective in some patients.  The administration of botulism toxin is accomplished by injecting a small amount of toxin into the muscles of the neck and head. Dosage must be titrated for each individual. Any benefits resulting from botulism toxin tend to wear off after 3 months with a repeat injection required if benefit is to be maintained. Injections are usually done every 3-4 months with maximum effect peak achieved by about 2 or 3 weeks. Botulism toxin is expensive and you should be sure of what costs you will incur resulting from the injection.  The side effects of botulism toxin use for chronic migraine may include:   -Transient, and usually mild, facial weakness with facial injections  -Transient, and usually mild, head or neck weakness with head/neck injections  -Reduction or loss of forehead facial animation due to forehead muscle weakness  -Eyelid drooping  -Dry eye  -Pain at the site of injection or bruising at the site of injection  -Double vision  -Potential unknown long term risks  Contraindications: You should not have Botox if you are pregnant, nursing, allergic to albumin, have an infection, skin condition, or muscle weakness at the site of the injection, or have myasthenia gravis, Lambert-Eaton syndrome, or ALS.  It is also  possible that as with any injection, there may be an allergic reaction or no effect from the medication. Reduced effectiveness after repeated injections is sometimes seen and rarely infection at the injection site may occur. All care will be taken to prevent these side effects. If therapy is given over a long time, atrophy and wasting in the muscle injected may occur. Occasionally the patient's become refractory to treatment because they develop antibodies to the toxin. In this event, therapy needs to be modified.  I have read the above information and consent to the administration of botulism toxin.    BOTOX PROCEDURE NOTE FOR MIGRAINE HEADACHE    Contraindications and precautions discussed with patient(above). Aseptic procedure was observed and patient tolerated procedure. Procedure performed by Dr. Georgia Dom  The condition has existed for more than 6 months, and pt does not have a diagnosis of ALS, Myasthenia Gravis or Lambert-Eaton Syndrome.  Risks and benefits of injections discussed and pt agrees to proceed with the procedure.  Written consent obtained  These injections are medically necessary. Pt  receives good benefits from these injections. These injections do not cause sedations or hallucinations which the oral therapies may cause.  Description of procedure:  The patient was placed in a sitting position. The standard protocol was used for Botox as follows, with 5 units of Botox injected at each site:   -Procerus muscle, midline injection  -Corrugator muscle, bilateral injection  -Frontalis muscle, bilateral injection, with 2 sites each side, medial injection was performed in the upper one third of the frontalis muscle, in the region vertical  from the medial inferior edge of the superior orbital rim. The lateral injection was again in the upper one third of the forehead vertically above the lateral limbus of the cornea, 1.5 cm lateral to the medial injection site.  - Levator  Scapulae: 5 units bilaterally  -Temporalis muscle injection, 5 sites, bilaterally. The first injection was 3 cm above the tragus of the ear, second injection site was 1.5 cm to 3 cm up from the first injection site in line with the tragus of the ear. The third injection site was 1.5-3 cm forward between the first 2 injection sites. The fourth injection site was 1.5 cm posterior to the second injection site. 5th site laterally in the temporalis  muscleat the level of the outer canthus.  - Patient feels her clenching is a trigger for headaches. +5 units masseter bilaterally   - Patient feels the migraines are centered around the eyes +5 units bilaterally at the outer canthus in the orbicularis occuli  -Occipitalis muscle injection, 3 sites, bilaterally. The first injection was done one half way between the occipital protuberance and the tip of the mastoid process behind the ear. The second injection site was done lateral and superior to the first, 1 fingerbreadth from the first injection. The third injection site was 1 fingerbreadth superiorly and medially from the first injection site.  -Cervical paraspinal muscle injection, 2 sites, bilateral knee first injection site was 1 cm from the midline of the cervical spine, 3 cm inferior to the lower border of the occipital protuberance. The second injection site was 1.5 cm superiorly and laterally to the first injection site.  -Trapezius muscle injection was performed at 3 sites, bilaterally. The first injection site was in the upper trapezius muscle halfway between the inflection point of the neck, and the acromion. The second injection site was one half way between the acromion and the first injection site. The third injection was done between the first injection site and the inflection point of the neck.   Will return for repeat injection in 3 months.   A 200 unit sof Botox was used, any Botox not injected was wasted. The patient tolerated the  procedure well, there were no complications of the above procedure.

## 2019-10-14 ENCOUNTER — Ambulatory Visit (INDEPENDENT_AMBULATORY_CARE_PROVIDER_SITE_OTHER): Payer: Medicare Other | Admitting: Neurology

## 2019-10-14 ENCOUNTER — Other Ambulatory Visit: Payer: Self-pay

## 2019-10-14 DIAGNOSIS — G43711 Chronic migraine without aura, intractable, with status migrainosus: Secondary | ICD-10-CM | POA: Diagnosis not present

## 2019-10-14 NOTE — Progress Notes (Addendum)
Updated pt signature obtained today on Botox side effect sheet  Botox- 100 units x 2 vials Lot: [id number] Expiration: 12/2021 NDC: 0298-4730-85  Bacteriostatic 0.9% Sodium Chloride- 42mL total Lot: [id number] Expiration: 11/13/2020 NDC: 0525-9102-89  Dx: K22.840 B/B

## 2019-11-12 DIAGNOSIS — Z1211 Encounter for screening for malignant neoplasm of colon: Secondary | ICD-10-CM | POA: Diagnosis not present

## 2019-11-12 DIAGNOSIS — K635 Polyp of colon: Secondary | ICD-10-CM | POA: Diagnosis not present

## 2019-11-12 DIAGNOSIS — D125 Benign neoplasm of sigmoid colon: Secondary | ICD-10-CM | POA: Diagnosis not present

## 2020-01-01 DIAGNOSIS — Z23 Encounter for immunization: Secondary | ICD-10-CM | POA: Diagnosis not present

## 2020-01-15 ENCOUNTER — Ambulatory Visit: Payer: Self-pay | Admitting: Family Medicine

## 2020-02-02 ENCOUNTER — Other Ambulatory Visit: Payer: Self-pay | Admitting: Neurology

## 2020-03-23 ENCOUNTER — Encounter: Payer: Self-pay | Admitting: Family Medicine

## 2020-03-23 NOTE — Telephone Encounter (Signed)
Pt last seen 07-2019, last odrer was 04-06-19.

## 2020-03-24 NOTE — Telephone Encounter (Signed)
Spoke with pt and she was not able to make any appt today due to previous appts.  Will make appt and place on my cancellation list.

## 2020-03-31 ENCOUNTER — Encounter: Payer: Self-pay | Admitting: Family Medicine

## 2020-03-31 ENCOUNTER — Telehealth (INDEPENDENT_AMBULATORY_CARE_PROVIDER_SITE_OTHER): Payer: Medicare Other | Admitting: Family Medicine

## 2020-03-31 DIAGNOSIS — Z9989 Dependence on other enabling machines and devices: Secondary | ICD-10-CM | POA: Diagnosis not present

## 2020-03-31 DIAGNOSIS — G4733 Obstructive sleep apnea (adult) (pediatric): Secondary | ICD-10-CM | POA: Diagnosis not present

## 2020-03-31 DIAGNOSIS — G43711 Chronic migraine without aura, intractable, with status migrainosus: Secondary | ICD-10-CM | POA: Diagnosis not present

## 2020-03-31 NOTE — Progress Notes (Addendum)
PATIENT: Elizabeth Kennedy DOB: 26-Aug-1970  REASON FOR VISIT: follow up HISTORY FROM: patient  Virtual Visit via Telephone Kennedy  I connected with Elizabeth Kennedy on 03/31/20 at  2:30 PM EST by telephone and verified that I am speaking with the correct person using two identifiers.   I discussed the limitations, risks, security and privacy concerns of performing an evaluation and management service by telephone and the availability of in person appointments. I also discussed with the patient that there may be a patient responsible charge related to this service. The patient expressed understanding and agreed to proceed.   History of Present Illness:  03/31/20 ALL:  She returns for follow for chronic Kennedy and OSA on CPAP. She admits that she has not used CPAP consistently. She has been fearful that her supplies need to be replaced. She notes more congestion when using current supplies. She called DME to request but was told she had to have a new prescription. She does continue to Kennedy significant improvement in memory and cognition when using CPAP. She feels that she sleeps better.   She has about 12 headache days per month with about 7 being migrainous. Elizabeth Kennedy aborts migraine about 50% of the time and will usually help reduce severity. She continues diet monitoring, yoga, chiropractic care and meditation. Last Botox was 09/2019. She has not returned due to a balanced owed.   Compliance report dated 12/31/2019-03/29/2020 reveals that she used CPAP 14/90 days for compliance of 16%. She used CPAp > 4 hours 1/90 days. Residual AHI was 1 on 5-12 cmH20. No significant leak.    08/12/2019 ALL:  Elizabeth Kennedy is a 50 y.o. female here today for follow up for chronic Kennedy and OSA on CPAP. She reports some improvement since starting CPAP therapy. She definitely notes sleep quality has improved. She feels that memory is sharper. She is trying to use CPAP daily but is  having about 9-10 migrainous headaches each month. She is unable to tolerate CPAP mask when she has a migraine. She does Kennedy that Elizabeth Kennedy helps with abortive therapy. She has resumed chiropractic care. She is getting massages as well that help.  She has tried and failed multiple preventative and abortive medications in the past.  Botox has seemed to be most effective for her with the least amount of side effects.  She wishes to resume Botox therapy.  Compliance report dated 05/13/2019 through 08/10/2019 reveals that she used CPAP 59 of the past 90 days for compliance of 66%.  She used CPAP greater than 4 hours 46 of the past 90 days for compliance of 51%.  Average usage was 5 hours and 5 minutes.  Residual AHI was 2.6 on 5 to 12 cm of water and an EPR of 3.  There was a leak noted in the 95th percentile of 33.1.  Leak has been much better over the past 30 days.  List of prior medications include Neurontin, doxepin, amitriptyline, Lamictal, nortriptyline, Tylenol, Effexor, Topamax, verapamil and Keppra. Imitrex, lidocaine nasal spray and Elizabeth Kennedy used for abortive therapy.   History (copied from my Kennedy on 04/07/2019)  Elizabeth Kennedy is a 50 y.o. female here today for follow up for chronic Kennedy. She was seen as a new patient in 11/2017 by Elizabeth Kennedy for chronic Kennedy. She had been on multiple preventative medications with little benefit or side effects. She has continued abortive therapy only since 06/2017 combined with chiropractic care. She has continued Elizabeth Kennedy  and tizanidine as needed which has been effective. She has noted an increase in intensity and frequency of Kennedy over the past 3 to 4 months. She wakes daily with a small tension type headache. She averages about 12 migraine days per month. She recently experienced a migraine that started with blurred vision, numbness, nausea then had severe pain about 40 minutes later that lasted  about 36 hours. She has had similar headaches in the past but it had been a while. She waited to take abortive medications as the headache didn't start right away. She had to repeat Elizabeth Kennedy and states that second dose helped. She describes having a "migraine hangover" for about 2 days. She felt better over the weekend but has had 1-2 tension type headaches. She did take Tylenol this morning with a headache. She does endorse weather changes as a trigger.   She was diagnosed with mild OSA in 05/2018. Elizabeth Kennedy that severity of OSA was root cause for chronic Kennedy but offered to start auto pap therapy as patient was complaining of excessive fatigue as well. She did not respond due to concerns of pandemic. She does Kennedy that she would like to try auto PAP therapy. She is a very restless sleeper and wakes up multiple times a night.    History (copied from Elizabeth Kennedy on 12/10/2017)  Elizabeth Kennedy a 50 y.o.femalehere as requested by Elizabeth Kennedy.Patient is new to me today that she was seen by my colleague Elizabeth Kennedy in July of this year and prior to that Elizabeth. Bobby Kennedy who retired. Past medical history Kennedy, anxiety, fibromyalgia, on disability due to chronic Kennedy history of PTSD. Her headaches maystart with blurry vision sometimes tunnel vision lasting for 30 minutes but has had Kennedy without aura as well. They are unilateral on the right. Severe pounding headache with associated light and noise sensitivity, nausea, movement makes it worse, sleep helps, she has intense migraine headaches with severe pain lasting for greater than 24 hours. She says she is either going into a migraine headache, having a migraine headache or coming out of a migraine headache rarely has headache free days. She is tried and failed multiple medications. She has failed multiple medications. She has had multiple side effects on medications and over the  last 20 years she has been on multiple medications. She has daily headaches and at least 15 migraine days a month. No medication overuse. Does not treat Kennedy with acute medication more than 2-3x a week. Sleeping well.  Reviewed notes, labs and imaging from outside physicians, which showed:  Reviewed records from Elizabeth Kennedy my colleague who previously saw patient. Patient stopped taking only daily preventative medications in May 2019. When she was seen in July she was experiencing 15 headache days a month. No medication overuse. She started going to a chiropractor. Prior to that she had a history of migraine headaches for many years. She has a history of Kennedy for 20 years previously under the care of Elizabeth. Elmore Guise 2018 at and neurologist Elizabeth. Suanne Marker for 14 years prior to that. Her most recent visit with Elizabeth. Bobby Kennedy she was given a lidocaine nose spray and Imitrex for acute management. List of prior medications include Neurontin, doxepin, amitriptyline, Lamictal, nortriptyline, Tylenol, Effexor, Topamax, verapamil and Keppra. And per notes, MRI of the brain without contrast in November 2016 and MRI of the brain without contrast 5-year 2016 were unremarkable.   At Elizabeth. Rhea Belton appointment she was started on  Effexor.  Observations/Objective:  Generalized: Well developed, in no acute distress  Mentation: Alert oriented to time, place, history taking. Follows all commands speech and language fluent   Assessment and Plan:  50 y.o. year old female  has a past medical history of Arthritis, Fibromyalgia, History of esophageal dilatation, History of exercise stress test (03-01-2010   Elizabeth hochrein), IBS (irritable bowel syndrome), Iron deficiency anemia, Menorrhagia, Kennedy, Seasonal allergies, and Wears glasses. here with    ICD-10-CM   1. OSA on CPAP  G47.33    Z99.89   2. Chronic migraine without aura, with intractable migraine, so stated, with status migrainosus  G43.711      Margarita Grizzle is doing better today.  She does Kennedy improvement in sleep quality and a sharper memory with CPAP usage.  I have encouraged her to continue using CPAP nightly and for greater than 4 hours each night. I will update supply orders today. She was encouraged to resume Botox as this has been helpful. She was advised to reach out to [facility] for any potenital patient assistance for Botox cost. She was encouraged to continue using Elizabeth Kennedy as needed for abortive therapy.  She has identified certain foods as triggers and was encouraged to avoid dairy, processed foods and simple carbohydrates.  Adequate hydration encouraged.  She will continue chiropractic and massage therapy.  She will follow-up with me in 3 to 4 months for review of compliance download as well as follow-up with Kennedy.  She verbalizes understanding and agreement with this plan.  No orders of the defined types were placed in this encounter.   No orders of the defined types were placed in this encounter.    Follow Up Instructions:  I discussed the assessment and treatment plan with the patient. The patient was provided an opportunity to ask questions and all were answered. The patient agreed with the plan and demonstrated an understanding of the instructions.   The patient was advised to call back or seek an in-person evaluation if the symptoms worsen or if the condition fails to improve as anticipated.  I provided 15 minutes of non-face-to-face time during this encounter.  Patient is located at her place of residence during my chart visit.  Providers in the office.   Debbora Presto, NP  Made any corrections needed, and agree with history, physical, neuro exam,assessment and plan as stated.     Sarina Ill, MD [facility]  Made any corrections needed, and agree with history, physical, neuro exam,assessment and plan as stated.     Sarina Ill, MD [facility]

## 2020-04-05 NOTE — Progress Notes (Signed)
Cm sent to [facility]

## 2020-04-06 ENCOUNTER — Other Ambulatory Visit: Payer: Self-pay

## 2020-04-06 ENCOUNTER — Ambulatory Visit
Admission: RE | Admit: 2020-04-06 | Discharge: 2020-04-06 | Disposition: A | Discharge status: Home / Self Care | Payer: Medicare Other | Source: Ambulatory Visit | Attending: Obstetrics and Gynecology | Admitting: Obstetrics and Gynecology

## 2020-04-06 ENCOUNTER — Other Ambulatory Visit: Payer: Self-pay | Admitting: Obstetrics and Gynecology

## 2020-04-06 DIAGNOSIS — N6489 Other specified disorders of breast: Secondary | ICD-10-CM

## 2020-04-06 DIAGNOSIS — R922 Inconclusive mammogram: Secondary | ICD-10-CM | POA: Diagnosis not present

## 2020-05-07 ENCOUNTER — Encounter: Payer: Self-pay | Admitting: Family Medicine

## 2020-05-11 NOTE — Telephone Encounter (Signed)
Jacquelyne Balint, RN Thank you, will process.   Janett Billow      Previous Messages   ----- Message -----  From: Brandon Melnick, RN  Sent: 05/10/2020  8:24 AM EDT  To: Vanessa Ralphs, Charmian Muff  Subject: cpap supplies                   Elizabeth Kennedy   , 08/24/1970   MRN:  [id number]  New cpap order for this pt. Thanks. SY

## 2020-06-17 ENCOUNTER — Encounter: Payer: Self-pay | Admitting: Family Medicine

## 2020-06-22 NOTE — Telephone Encounter (Signed)
Jacquelyne Balint, RN "Our resupply team has been working on this since March. They are waiting for a detailed order in [employer] to be signed off on as pt has Medicare and this is required before we can provide supplies. Once this has been signed by Dr. Brett Fairy, supplies will be shipped. Pt can also contact the local office at [phone] and see if they have what she needs in stock."   Spoke with [employer].  This order has been signed off on [employer]. and pt should be able to get supplies.

## 2020-10-26 ENCOUNTER — Other Ambulatory Visit: Payer: Self-pay

## 2020-10-26 ENCOUNTER — Ambulatory Visit
Admission: RE | Admit: 2020-10-26 | Discharge: 2020-10-26 | Disposition: A | Discharge status: Home / Self Care | Payer: Medicare Other | Source: Ambulatory Visit | Attending: Obstetrics and Gynecology | Admitting: Obstetrics and Gynecology

## 2020-10-26 DIAGNOSIS — N6489 Other specified disorders of breast: Secondary | ICD-10-CM | POA: Diagnosis not present

## 2020-10-26 DIAGNOSIS — R922 Inconclusive mammogram: Secondary | ICD-10-CM | POA: Diagnosis not present

## 2021-01-21 ENCOUNTER — Encounter: Payer: Self-pay | Admitting: Family Medicine

## 2021-03-10 ENCOUNTER — Encounter: Payer: Self-pay | Admitting: Family Medicine

## 2021-03-15 DIAGNOSIS — Z23 Encounter for immunization: Secondary | ICD-10-CM | POA: Diagnosis not present

## 2021-09-28 DIAGNOSIS — Z01419 Encounter for gynecological examination (general) (routine) without abnormal findings: Secondary | ICD-10-CM | POA: Diagnosis not present

## 2021-10-28 ENCOUNTER — Other Ambulatory Visit: Payer: Self-pay | Admitting: Obstetrics and Gynecology

## 2021-10-28 DIAGNOSIS — N6489 Other specified disorders of breast: Secondary | ICD-10-CM

## 2021-10-28 DIAGNOSIS — R928 Other abnormal and inconclusive findings on diagnostic imaging of breast: Secondary | ICD-10-CM

## 2022-02-22 ENCOUNTER — Ambulatory Visit
Admission: RE | Admit: 2022-02-22 | Discharge: 2022-02-22 | Disposition: A | Discharge status: Home / Self Care | Payer: Medicare Other | Source: Ambulatory Visit | Attending: Obstetrics and Gynecology | Admitting: Obstetrics and Gynecology

## 2022-02-22 ENCOUNTER — Other Ambulatory Visit: Payer: Self-pay | Admitting: Obstetrics and Gynecology

## 2022-02-22 DIAGNOSIS — N6489 Other specified disorders of breast: Secondary | ICD-10-CM | POA: Diagnosis not present

## 2022-02-22 DIAGNOSIS — R928 Other abnormal and inconclusive findings on diagnostic imaging of breast: Secondary | ICD-10-CM

## 2022-02-22 DIAGNOSIS — R922 Inconclusive mammogram: Secondary | ICD-10-CM | POA: Diagnosis not present

## 2022-04-25 DIAGNOSIS — H66012 Acute suppurative otitis media with spontaneous rupture of ear drum, left ear: Secondary | ICD-10-CM | POA: Diagnosis not present

## 2022-04-25 DIAGNOSIS — R030 Elevated blood-pressure reading, without diagnosis of hypertension: Secondary | ICD-10-CM | POA: Diagnosis not present

## 2022-04-25 DIAGNOSIS — J029 Acute pharyngitis, unspecified: Secondary | ICD-10-CM | POA: Diagnosis not present
